# Patient Record
Sex: Male | Born: 1965
Health system: Southern US, Community
[De-identification: ages and names within clinical notes are randomized; demographics above are authoritative.]

## PROBLEM LIST (undated history)

## (undated) DIAGNOSIS — E119 Type 2 diabetes mellitus without complications: Secondary | ICD-10-CM

## (undated) DIAGNOSIS — E291 Testicular hypofunction: Secondary | ICD-10-CM

## (undated) DIAGNOSIS — E789 Disorder of lipoprotein metabolism, unspecified: Secondary | ICD-10-CM

## (undated) DIAGNOSIS — F32A Depression, unspecified: Secondary | ICD-10-CM

## (undated) DIAGNOSIS — I10 Essential (primary) hypertension: Secondary | ICD-10-CM

## (undated) DIAGNOSIS — E7800 Pure hypercholesterolemia, unspecified: Secondary | ICD-10-CM

## (undated) DIAGNOSIS — F329 Major depressive disorder, single episode, unspecified: Secondary | ICD-10-CM

## (undated) DIAGNOSIS — Z87442 Personal history of urinary calculi: Secondary | ICD-10-CM

## (undated) HISTORY — DX: Type 2 diabetes mellitus without complications: E11.9

## (undated) HISTORY — DX: Testicular hypofunction: E29.1

## (undated) HISTORY — DX: Major depressive disorder, single episode, unspecified: F32.9

## (undated) HISTORY — PX: WISDOM TOOTH EXTRACTION: SHX21

## (undated) HISTORY — DX: Depression, unspecified: F32.A

---

## 1998-04-30 ENCOUNTER — Inpatient Hospital Stay (HOSPITAL_COMMUNITY): Admission: EM | Admit: 1998-04-30 | Discharge: 1998-05-04 | Payer: Self-pay | Admitting: *Deleted

## 1998-05-05 ENCOUNTER — Encounter (HOSPITAL_COMMUNITY): Admission: RE | Admit: 1998-05-05 | Discharge: 1998-08-03 | Payer: Self-pay | Admitting: Psychiatry

## 2010-05-24 ENCOUNTER — Emergency Department (HOSPITAL_COMMUNITY): Admission: EM | Admit: 2010-05-24 | Discharge: 2010-05-24 | Payer: Self-pay | Admitting: Emergency Medicine

## 2011-10-26 ENCOUNTER — Encounter: Payer: Self-pay | Admitting: *Deleted

## 2011-10-26 ENCOUNTER — Emergency Department (INDEPENDENT_AMBULATORY_CARE_PROVIDER_SITE_OTHER)
Admission: EM | Admit: 2011-10-26 | Discharge: 2011-10-26 | Disposition: A | Discharge status: Home / Self Care | Payer: PRIVATE HEALTH INSURANCE | Source: Home / Self Care | Attending: Emergency Medicine | Admitting: Emergency Medicine

## 2011-10-26 ENCOUNTER — Emergency Department (INDEPENDENT_AMBULATORY_CARE_PROVIDER_SITE_OTHER): Payer: PRIVATE HEALTH INSURANCE

## 2011-10-26 DIAGNOSIS — S335XXA Sprain of ligaments of lumbar spine, initial encounter: Secondary | ICD-10-CM

## 2011-10-26 DIAGNOSIS — S39012A Strain of muscle, fascia and tendon of lower back, initial encounter: Secondary | ICD-10-CM

## 2011-10-26 HISTORY — DX: Essential (primary) hypertension: I10

## 2011-10-26 HISTORY — DX: Disorder of lipoprotein metabolism, unspecified: E78.9

## 2011-10-26 HISTORY — DX: Pure hypercholesterolemia, unspecified: E78.00

## 2011-10-26 MED ORDER — DICLOFENAC SODIUM 75 MG PO TBEC: 75.0000 mg | DELAYED_RELEASE_TABLET | Freq: Two times a day (BID) | ORAL | Status: AC

## 2011-10-26 MED ORDER — ACETAMINOPHEN-CODEINE #3 300-30 MG PO TABS: 1.0000 | ORAL_TABLET | ORAL | Status: AC | PRN

## 2011-10-26 MED ORDER — CYCLOBENZAPRINE HCL 5 MG PO TABS
5.0000 mg | ORAL_TABLET | Freq: Three times a day (TID) | ORAL | Status: AC | PRN
Start: 2011-10-26 — End: 2011-11-05

## 2011-10-26 NOTE — Discharge Instructions (Signed)
Back Exercises Back exercises help treat and prevent back injuries. The goal of back exercises is to increase the strength of your abdominal and back muscles and the flexibility of your back. These exercises should be started when you no longer have back pain. Back exercises include:  Pelvic Tilt. Lie on your back with your knees bent. Tilt your pelvis until the lower part of your back is against the floor. Hold this position 5 to 10 sec and repeat 5 to 10 times.   Knee to Chest. Pull first 1 knee up against your chest and hold for 20 to 30 seconds, repeat this with the other knee, and then both knees. This may be done with the other leg straight or bent, whichever feels better.   Sit-Ups or Curl-Ups. Bend your knees 90 degrees. Start with tilting your pelvis, and do a partial, slow sit-up, lifting your trunk only 30 to 45 degrees off the floor. Take at least 2 to 3 seconds for each sit-up. Do not do sit-ups with your knees out straight. If partial sit-ups are difficult, simply do the above but with only tightening your abdominal muscles and holding it as directed.   Hip-Lift. Lie on your back with your knees flexed 90 degrees. Push down with your feet and shoulders as you raise your hips a couple inches off the floor; hold for 10 seconds, repeat 5 to 10 times.   Back arches. Lie on your stomach, propping yourself up on bent elbows. Slowly press on your hands, causing an arch in your low back. Repeat 3 to 5 times. Any initial stiffness and discomfort should lessen with repetition over time.   Shoulder-Lifts. Lie face down with arms beside your body. Keep hips and torso pressed to floor as you slowly lift your head and shoulders off the floor.  Do not overdo your exercises, especially in the beginning. Exercises may cause you some mild back discomfort which lasts for a few minutes; however, if the pain is more severe, or lasts for more than 15 minutes, do not continue exercises until you see your  caregiver. Improvement with exercise therapy for back problems is slow.  See your caregivers for assistance with developing a proper back exercise program. Document Released: 11/29/2004 Document Revised: 06/20/2011 Document Reviewed: 10/22/2005 [facility] Patient Information 2012 [place], [place].   Do back exercises twice weekly followed by moist heat.  See Elvera Lennox in 2 weeks.
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
## 2016-12-07 ENCOUNTER — Encounter: Payer: Self-pay | Admitting: Podiatry

## 2016-12-07 ENCOUNTER — Ambulatory Visit (INDEPENDENT_AMBULATORY_CARE_PROVIDER_SITE_OTHER): Payer: PRIVATE HEALTH INSURANCE | Admitting: Podiatry

## 2016-12-07 VITALS — BP 148/92 | HR 82 | Resp 16 | Ht 66.0 in | Wt 225.0 lb

## 2016-12-07 DIAGNOSIS — B351 Tinea unguium: Secondary | ICD-10-CM

## 2016-12-07 DIAGNOSIS — M79604 Pain in right leg: Secondary | ICD-10-CM | POA: Diagnosis not present

## 2016-12-07 DIAGNOSIS — M79605 Pain in left leg: Secondary | ICD-10-CM

## 2016-12-07 NOTE — Progress Notes (Signed)
   Subjective:    Patient ID: Adam Mueller, male    DOB: [birth year], 51 y.o.   MRN: [id number]  HPI Chief Complaint  Patient presents with  . Nail Problem    Bilateral; all toes; pt stated, "wants checked for nail fungus"      Review of Systems  Genitourinary: Positive for frequency.  All other systems reviewed and are negative.      Objective:   Physical Exam        Assessment & Plan:

## 2016-12-08 NOTE — Progress Notes (Signed)
Subjective:     Patient ID: Adam Mueller, male   DOB: 14-Sep-1966, [age] y.o.   MRN: [id number]  HPI patient presents with thick nail disease 1-5 both feet that he has not been able to cut and they become painful and he wants to know what treatment options are available   Review of Systems  All other systems reviewed and are negative.      Objective:   Physical Exam  Constitutional: He is oriented to person, place, and time.  Cardiovascular: Intact distal pulses.   Musculoskeletal: Normal range of motion.  Neurological: He is oriented to person, place, and time.  Skin: Skin is warm.  Nursing note and vitals reviewed.  Neurovascular status intact muscle strength adequate range of motion within normal limits with patient found to have thick deformed nailbeds 1-5 both feet that are moderately tender with odor noted associated with this and long-term history     Assessment:     Chronic mycotic nail infections with thickness pain and inability to wear shoe gear comfortably    Plan:     H&P condition and education rendered to patient. Due to thickness and length of time she's had this and do not believe he would respond to topical or oral medication and I did do deep debridement of all nails smooth them down and this will be done every 3 months. He could consider oral medicine and that decision can be made in future

## 2017-03-08 ENCOUNTER — Ambulatory Visit: Payer: PRIVATE HEALTH INSURANCE

## 2017-09-09 ENCOUNTER — Encounter (HOSPITAL_COMMUNITY): Payer: Self-pay | Admitting: Emergency Medicine

## 2017-09-09 ENCOUNTER — Emergency Department (HOSPITAL_COMMUNITY): Payer: No Typology Code available for payment source

## 2017-09-09 DIAGNOSIS — M7522 Bicipital tendinitis, left shoulder: Secondary | ICD-10-CM | POA: Diagnosis not present

## 2017-09-09 DIAGNOSIS — R0602 Shortness of breath: Secondary | ICD-10-CM | POA: Insufficient documentation

## 2017-09-09 DIAGNOSIS — R079 Chest pain, unspecified: Secondary | ICD-10-CM | POA: Diagnosis present

## 2017-09-09 DIAGNOSIS — E119 Type 2 diabetes mellitus without complications: Secondary | ICD-10-CM | POA: Insufficient documentation

## 2017-09-09 DIAGNOSIS — M25512 Pain in left shoulder: Secondary | ICD-10-CM | POA: Insufficient documentation

## 2017-09-09 DIAGNOSIS — I10 Essential (primary) hypertension: Secondary | ICD-10-CM | POA: Insufficient documentation

## 2017-09-09 LAB — CBC
HCT: 39.6 % (ref 39.0–52.0)
Hemoglobin: 12.8 g/dL — ABNORMAL LOW (ref 13.0–17.0)
MCH: 25.5 pg — ABNORMAL LOW (ref 26.0–34.0)
MCHC: 32.3 g/dL (ref 30.0–36.0)
MCV: 78.9 fL (ref 78.0–100.0)
Platelets: 257 10*3/uL (ref 150–400)
RBC: 5.02 MIL/uL (ref 4.22–5.81)
RDW: 14.2 % (ref 11.5–15.5)
WBC: 9.2 10*3/uL (ref 4.0–10.5)

## 2017-09-09 LAB — BASIC METABOLIC PANEL
ANION GAP: 7 (ref 5–15)
BUN: 10 mg/dL (ref 6–20)
CALCIUM: 8.8 mg/dL — AB (ref 8.9–10.3)
CHLORIDE: 104 mmol/L (ref 101–111)
CO2: 23 mmol/L (ref 22–32)
Creatinine, Ser: 0.88 mg/dL (ref 0.61–1.24)
GFR calc non Af Amer: >60 mL/min (ref >60)
Glucose, Bld: 130 mg/dL — ABNORMAL HIGH (ref 65–99)
Potassium: 3.7 mmol/L (ref 3.5–5.1)
SODIUM: 134 mmol/L — AB (ref 135–145)

## 2017-09-09 NOTE — ED Notes (Signed)
Istat trop = 0.00.

## 2017-09-09 NOTE — ED Triage Notes (Signed)
Pt c/o 8/10 left cp radiating to his left arm, some dizziness and nausea, no vomiting, no diarrhea.

## 2017-09-10 ENCOUNTER — Emergency Department (HOSPITAL_COMMUNITY)
Admission: EM | Admit: 2017-09-10 | Discharge: 2017-09-10 | Disposition: A | Discharge status: Home / Self Care | Payer: No Typology Code available for payment source | Attending: Emergency Medicine | Admitting: Emergency Medicine

## 2017-09-10 DIAGNOSIS — M7522 Bicipital tendinitis, left shoulder: Secondary | ICD-10-CM

## 2017-09-10 LAB — I-STAT TROPONIN, ED: Troponin i, poc: 0 ng/mL (ref 0.00–0.08)

## 2017-09-10 MED ORDER — NAPROXEN 500 MG PO TABS: 500.0000 mg | ORAL_TABLET | Freq: Two times a day (BID) | ORAL | 0 refills | Status: DC

## 2017-09-10 MED ORDER — IBUPROFEN 400 MG PO TABS
600.0000 mg | ORAL_TABLET | Freq: Once | ORAL | Status: AC
  Administered 2017-09-10: 600 mg via ORAL
  Filled 2017-09-10: qty 1

## 2017-09-10 MED ORDER — CYCLOBENZAPRINE HCL 10 MG PO TABS: 10.0000 mg | ORAL_TABLET | Freq: Every evening | ORAL | 0 refills | Status: DC | PRN

## 2017-09-10 MED ORDER — CYCLOBENZAPRINE HCL 10 MG PO TABS
5.0000 mg | ORAL_TABLET | Freq: Once | ORAL | Status: AC
  Administered 2017-09-10: 5 mg via ORAL
  Filled 2017-09-10: qty 1

## 2017-09-10 NOTE — ED Notes (Signed)
Pt c/o left shoulder pain that radiated to his chest while at work, pain worsens with movement of left arm, tried topical analgesic with no relief. A/ox4, resp e/u, nad.

## 2017-09-10 NOTE — ED Notes (Signed)
ED Provider at bedside. 

## 2017-09-10 NOTE — Discharge Instructions (Signed)
Take Naproxen twice a day for pain Take Flexeril as needed Follow up with your doctor
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
## 2018-05-06 ENCOUNTER — Emergency Department (HOSPITAL_BASED_OUTPATIENT_CLINIC_OR_DEPARTMENT_OTHER)
Admission: EM | Admit: 2018-05-06 | Discharge: 2018-05-06 | Disposition: A | Discharge status: Home / Self Care | Payer: No Typology Code available for payment source | Attending: Emergency Medicine | Admitting: Emergency Medicine

## 2018-05-06 ENCOUNTER — Emergency Department (HOSPITAL_BASED_OUTPATIENT_CLINIC_OR_DEPARTMENT_OTHER): Payer: No Typology Code available for payment source

## 2018-05-06 ENCOUNTER — Encounter (HOSPITAL_BASED_OUTPATIENT_CLINIC_OR_DEPARTMENT_OTHER): Payer: Self-pay | Admitting: Emergency Medicine

## 2018-05-06 ENCOUNTER — Other Ambulatory Visit: Payer: Self-pay

## 2018-05-06 DIAGNOSIS — N23 Unspecified renal colic: Secondary | ICD-10-CM | POA: Insufficient documentation

## 2018-05-06 DIAGNOSIS — R1031 Right lower quadrant pain: Secondary | ICD-10-CM | POA: Insufficient documentation

## 2018-05-06 DIAGNOSIS — R42 Dizziness and giddiness: Secondary | ICD-10-CM | POA: Diagnosis not present

## 2018-05-06 DIAGNOSIS — Z79899 Other long term (current) drug therapy: Secondary | ICD-10-CM | POA: Diagnosis not present

## 2018-05-06 DIAGNOSIS — E119 Type 2 diabetes mellitus without complications: Secondary | ICD-10-CM | POA: Diagnosis not present

## 2018-05-06 DIAGNOSIS — R110 Nausea: Secondary | ICD-10-CM | POA: Insufficient documentation

## 2018-05-06 DIAGNOSIS — I10 Essential (primary) hypertension: Secondary | ICD-10-CM | POA: Diagnosis not present

## 2018-05-06 DIAGNOSIS — Z7902 Long term (current) use of antithrombotics/antiplatelets: Secondary | ICD-10-CM | POA: Insufficient documentation

## 2018-05-06 DIAGNOSIS — R109 Unspecified abdominal pain: Secondary | ICD-10-CM | POA: Diagnosis present

## 2018-05-06 LAB — COMPREHENSIVE METABOLIC PANEL
ALBUMIN: 3.6 g/dL (ref 3.5–5.0)
ALT: 15 U/L (ref 0–44)
ANION GAP: 7 (ref 5–15)
AST: 18 U/L (ref 15–41)
Alkaline Phosphatase: 86 U/L (ref 38–126)
BILIRUBIN TOTAL: 0.7 mg/dL (ref 0.3–1.2)
BUN: 14 mg/dL (ref 6–20)
CO2: 25 mmol/L (ref 22–32)
Calcium: 8.4 mg/dL — ABNORMAL LOW (ref 8.9–10.3)
Chloride: 107 mmol/L (ref 98–111)
Creatinine, Ser: 1.23 mg/dL (ref 0.61–1.24)
GFR calc Af Amer: >60 mL/min (ref >60)
GFR calc non Af Amer: >60 mL/min (ref >60)
GLUCOSE: 141 mg/dL — AB (ref 70–99)
POTASSIUM: 3.8 mmol/L (ref 3.5–5.1)
SODIUM: 139 mmol/L (ref 135–145)
Total Protein: 7.3 g/dL (ref 6.5–8.1)

## 2018-05-06 LAB — CBC WITH DIFFERENTIAL/PLATELET
BASOS ABS: 0 10*3/uL (ref 0.0–0.1)
Basophils Relative: 0 %
EOS PCT: 1 %
Eosinophils Absolute: 0.1 10*3/uL (ref 0.0–0.7)
HEMATOCRIT: 39.4 % (ref 39.0–52.0)
Hemoglobin: 12.9 g/dL — ABNORMAL LOW (ref 13.0–17.0)
LYMPHS ABS: 1.7 10*3/uL (ref 0.7–4.0)
LYMPHS PCT: 19 %
MCH: 25.6 pg — AB (ref 26.0–34.0)
MCHC: 32.7 g/dL (ref 30.0–36.0)
MCV: 78.2 fL (ref 78.0–100.0)
MONO ABS: 1.2 10*3/uL — AB (ref 0.1–1.0)
MONOS PCT: 13 %
NEUTROS ABS: 6.2 10*3/uL (ref 1.7–7.7)
Neutrophils Relative %: 67 %
PLATELETS: 241 10*3/uL (ref 150–400)
RBC: 5.04 MIL/uL (ref 4.22–5.81)
RDW: 14.9 % (ref 11.5–15.5)
WBC: 9.2 10*3/uL (ref 4.0–10.5)

## 2018-05-06 LAB — URINALYSIS, ROUTINE W REFLEX MICROSCOPIC
Bilirubin Urine: NEGATIVE
GLUCOSE, UA: NEGATIVE mg/dL
KETONES UR: NEGATIVE mg/dL
Leukocytes, UA: NEGATIVE
NITRITE: NEGATIVE
PH: 5.5 (ref 5.0–8.0)
PROTEIN: NEGATIVE mg/dL
SPECIFIC GRAVITY, URINE: 1.02 (ref 1.005–1.030)

## 2018-05-06 LAB — URINALYSIS, MICROSCOPIC (REFLEX): WBC, UA: NONE SEEN WBC/hpf (ref 0–5)

## 2018-05-06 LAB — LIPASE, BLOOD: Lipase: 26 U/L (ref 11–51)

## 2018-05-06 MED ORDER — OXYCODONE-ACETAMINOPHEN 5-325 MG PO TABS: 1.0000 | ORAL_TABLET | Freq: Three times a day (TID) | ORAL | 0 refills | Status: DC | PRN

## 2018-05-06 MED ORDER — IOPAMIDOL (ISOVUE-300) INJECTION 61%
100.0000 mL | Freq: Once | INTRAVENOUS | Status: AC | PRN
  Administered 2018-05-06: 100 mL via INTRAVENOUS

## 2018-05-06 MED ORDER — SODIUM CHLORIDE 0.9 % IV BOLUS
500.0000 mL | Freq: Once | INTRAVENOUS | Status: AC
  Administered 2018-05-06: 500 mL via INTRAVENOUS

## 2018-05-06 MED ORDER — TAMSULOSIN HCL 0.4 MG PO CAPS: 0.4000 mg | ORAL_CAPSULE | Freq: Every day | ORAL | 0 refills | Status: DC

## 2018-05-06 MED ORDER — KETOROLAC TROMETHAMINE 15 MG/ML IJ SOLN
15.0000 mg | Freq: Once | INTRAMUSCULAR | Status: AC
Start: 2018-05-06 — End: 2018-05-06
  Administered 2018-05-06: 15 mg via INTRAVENOUS
  Filled 2018-05-06: qty 1

## 2018-05-06 MED FILL — OXYCODONE-ACETAMINOPHEN 5-3: 5-325 | 4 days supply | Qty: 12 | Fill #0

## 2018-05-06 MED FILL — TAMSULOSIN HCL 0.4 MG CAP: 0.4 | 30 days supply | Qty: 30 | Fill #0

## 2018-05-06 NOTE — Discharge Instructions (Addendum)
You have a 7mm stone in your right ureter.  Get rechecked immediately if you develop fevers, can't urinate, or have uncontrolled pain.

## 2018-05-06 NOTE — ED Notes (Signed)
Pt given strainer to take home.  

## 2018-05-06 NOTE — ED Triage Notes (Signed)
Right side pain that occasionally radiates to abdomen for two days.  Some nausea.  No V/D.  No fever.  Pt was at work today and stood up becoming dizzy. Pt continues to have dizziness lying down but has improved.  Pt eating and drinking normally.

## 2018-05-06 NOTE — ED Provider Notes (Signed)
[facility] EMERGENCY DEPARTMENT Provider Note   CSN: [id number] Arrival date & time: 05/06/18  4098     History   Chief Complaint Chief Complaint  Patient presents with  . Abdominal Pain    right side    HPI RANDLE SHATZER is a 52 y.o. male.  The history is provided by the patient. No language interpreter was used.  Abdominal Pain     LAUREL SMELTZ is a 52 y.o. male who presents to the Emergency Department complaining of abdominal pain. He reports of right sided abdominal pain that began two days ago. Pain is located along the right axillary line and radiates to the right lower quadrant. It is worse with standing and better with activity. He has associated nausea as well as dizziness at times. He denies any fevers, vomiting, dysuria, chest pain, shortness of breath. No prior similar symptoms. He has a history of hypertension, hyperlipidemia and prediabetes. Symptoms are moderate, constant, worsening. Past Medical History:  Diagnosis Date  . Cholesterol serum elevated   . Depression   . Diabetes mellitus without complication (HCC)   . Hypertension   . Hypogonadism in male     There are no active problems to display for this patient.   No past surgical history on file.      Home Medications    Prior to Admission medications   Medication Sig Start Date End Date Taking? Authorizing Provider  amLODipine (NORVASC) 10 MG tablet Take 10 mg by mouth daily.    [provider]  cyclobenzaprine (FLEXERIL) 10 MG tablet Take 1 tablet (10 mg total) at bedtime and may repeat dose one time if needed by mouth. 09/10/17   Bethel Born, PA-C  losartan (COZAAR) 100 MG tablet Take 100 mg by mouth daily.      [provider]  naproxen (NAPROSYN) 500 MG tablet Take 1 tablet (500 mg total) 2 (two) times daily by mouth. 09/10/17   Bethel Born, PA-C  oxyCODONE-acetaminophen (PERCOCET/ROXICET) 5-325 MG tablet Take 1 tablet by mouth every 8 (eight) hours as  needed for severe pain. 05/06/18   Tilden Fossa, MD  pravastatin (PRAVACHOL) 40 MG tablet Take 40 mg by mouth daily.      [provider]  tadalafil (CIALIS) 20 MG tablet Take 20 mg daily as needed by mouth for erectile dysfunction.    [provider]  tamsulosin (FLOMAX) 0.4 MG CAPS capsule Take 1 capsule (0.4 mg total) by mouth daily. 05/06/18   Tilden Fossa, MD    Family History Family History  Problem Relation Age of Onset  . Diabetes Mother   . Heart failure Mother     Social History Social History   Tobacco Use  . Smoking status: Never Smoker  . Smokeless tobacco: Never Used  Substance Use Topics  . Alcohol use: No  . Drug use: Not on file     Allergies   Patient has no known allergies.   Review of Systems Review of Systems  Gastrointestinal: Positive for abdominal pain.  All other systems reviewed and are negative.    Physical Exam Updated Vital Signs BP (!) 145/94 (BP Location: Right Arm)   Pulse 66   Temp 98 F (36.7 C) (Oral)   Resp 18   Ht 5\' 6"  (1.676 m)   Wt 106.6 kg (235 lb)   SpO2 96%   BMI 37.93 kg/m   Physical Exam  Constitutional: He is oriented to person, place, and time. He appears  well-developed and well-nourished.  HENT:  Head: Normocephalic and atraumatic.  Cardiovascular: Normal rate and regular rhythm.  No murmur heard. Pulmonary/Chest: Effort normal and breath sounds normal. No respiratory distress.  Abdominal: Soft. There is no rebound and no guarding.  Mild to moderate tenderness in the right lower quadrant and right mid abdomen. No guarding or rebound. Negative Murphy's.  Musculoskeletal: He exhibits no edema or tenderness.  Neurological: He is alert and oriented to person, place, and time.  Skin: Skin is warm and dry.  Psychiatric: He has a normal mood and affect. His behavior is normal.  Nursing note and vitals reviewed.    ED Treatments / Results  Labs (all labs ordered are listed, but only  abnormal results are displayed) Labs Reviewed  COMPREHENSIVE METABOLIC PANEL - Abnormal; Notable for the following components:      Result Value   Glucose, Bld 141 (*)    Calcium 8.4 (*)    All other components within normal limits  CBC WITH DIFFERENTIAL/PLATELET - Abnormal; Notable for the following components:   Hemoglobin 12.9 (*)    MCH 25.6 (*)    Monocytes Absolute 1.2 (*)    All other components within normal limits  URINALYSIS, ROUTINE W REFLEX MICROSCOPIC - Abnormal; Notable for the following components:   Hgb urine dipstick LARGE (*)    All other components within normal limits  URINALYSIS, MICROSCOPIC (REFLEX) - Abnormal; Notable for the following components:   Bacteria, UA FEW (*)    All other components within normal limits  LIPASE, BLOOD    EKG None  Radiology Ct Abdomen Pelvis W Contrast  Result Date: 05/06/2018 CLINICAL DATA:  Right-sided abdominal pain, some nausea EXAM: CT ABDOMEN AND PELVIS WITH CONTRAST TECHNIQUE: Multidetector CT imaging of the abdomen and pelvis was performed using the standard protocol following bolus administration of intravenous contrast. CONTRAST:  ISOVUE-300 IOPAMIDOL (ISOVUE-300) INJECTION 61% COMPARISON:  Lumbar spine films of 10/26/2011 FINDINGS: Lower chest: The lung bases are clear. The heart is borderline enlarged. Hepatobiliary: The liver enhances with no focal abnormality and no ductal dilatation is seen. No calcified gallstones are noted. Pancreas: The pancreas is normal in size and the pancreatic duct is not dilated. Spleen: The spleen is unremarkable. Adrenals/Urinary Tract: The adrenal glands appear normal. The kidneys enhance and there is somewhat delayed excretion of contrast by the right kidney with mild perinephric strandiness. There is slight right sided hydronephrosis and hydroureter present to point of obstruction by a 7 mm mid proximal right ureteral calculus. No additional renal calculi are noted. The more distal ureters  are normal in caliber. The urinary bladder is slightly thick-walled but not well distended. This could be due to lack of distension or a degree of bladder outlet obstruction versus edema. Stomach/Bowel: The stomach is not well distended. No small bowel abnormality is seen. There is some feces throughout the colon. Terminal ileum is unremarkable. The appendix is well seen with no evidence of acute appendicitis. Vascular/Lymphatic: The abdominal aorta is normal in caliber with mild abdominal aortic atherosclerosis noted. No adenopathy is seen with only small nodes present. Reproductive: The prostate is minimally prominent for age. Other: No abdominal wall hernia is noted. Musculoskeletal: The lumbar vertebrae are in normal alignment with normal intervertebral disc spaces. The SI joints appear well corticated. IMPRESSION: 1. Mild right hydronephrosis and proximal right hydroureter due to a 7 mm mid proximal right ureteral calculus. No additional renal calculi are seen. 2. Somewhat thick-walled urinary bladder may be due to lack  of distension although edema as with cystitis or a degree of bladder outlet obstruction cannot be excluded. Minimal prominence of the prostate. 3. No abnormality of the appendix is seen. The terminal ileum also is unremarkable. Electronically Signed   By: Dwyane DeePaul  Barry M.D.   On: 05/06/2018 10:11    Procedures Procedures (including critical care time)  Medications Ordered in ED Medications  sodium chloride 0.9 % bolus 500 mL (0 mLs Intravenous Stopped 05/06/18 1003)  iopamidol (ISOVUE-300) 61 % injection 100 mL (100 mLs Intravenous Contrast Given 05/06/18 0952)     Initial Impression / Assessment and Plan / ED Course  I have reviewed the triage vital signs and the nursing notes.  Pertinent labs & imaging results that were available during my care of the patient were reviewed by me and considered in my medical decision making (see chart for details).     Patient here for evaluation  of right sided abdominal pain. He does have some mild tenderness on examination with no peritoneal findings. CT scan does demonstrate a right ureteral calculus. Labs demonstrate normal renal function. UA not consistent with UTI. Patient's pain is controlled in the department. Discussed with patient findings of kidney stone. Discussed importance of urology follow-up as well as close return precautions.  No evidence of acute infectious process, appendicitis, bowel obstruction.  Final Clinical Impressions(s) / ED Diagnoses   Final diagnoses:  Renal colic on right side    ED Discharge Orders        Ordered    oxyCODONE-acetaminophen (PERCOCET/ROXICET) 5-325 MG tablet  Every 8 hours PRN     05/06/18 1042    tamsulosin (FLOMAX) 0.4 MG CAPS capsule  Daily     05/06/18 1042       Tilden Fossaees, Delorice Bannister, MD 05/06/18 1044

## 2018-05-06 NOTE — ED Notes (Signed)
Patient transported to CT 

## 2018-05-07 ENCOUNTER — Other Ambulatory Visit: Payer: Self-pay | Admitting: Urology

## 2018-05-14 ENCOUNTER — Encounter (HOSPITAL_COMMUNITY): Payer: Self-pay | Admitting: General Practice

## 2018-05-15 ENCOUNTER — Encounter (HOSPITAL_COMMUNITY)
Admission: RE | Disposition: A | Discharge status: Home / Self Care | Payer: Self-pay | Source: Ambulatory Visit | Attending: Urology

## 2018-05-15 ENCOUNTER — Ambulatory Visit (HOSPITAL_COMMUNITY)
Admission: RE | Admit: 2018-05-15 | Discharge: 2018-05-15 | Disposition: A | Discharge status: Home / Self Care | Payer: No Typology Code available for payment source | Source: Ambulatory Visit | Attending: Urology | Admitting: Urology

## 2018-05-15 ENCOUNTER — Ambulatory Visit (HOSPITAL_COMMUNITY): Payer: No Typology Code available for payment source

## 2018-05-15 ENCOUNTER — Encounter (HOSPITAL_COMMUNITY): Payer: Self-pay | Admitting: *Deleted

## 2018-05-15 DIAGNOSIS — N201 Calculus of ureter: Secondary | ICD-10-CM

## 2018-05-15 DIAGNOSIS — E785 Hyperlipidemia, unspecified: Secondary | ICD-10-CM | POA: Insufficient documentation

## 2018-05-15 DIAGNOSIS — Z6836 Body mass index (BMI) 36.0-36.9, adult: Secondary | ICD-10-CM | POA: Diagnosis not present

## 2018-05-15 DIAGNOSIS — E669 Obesity, unspecified: Secondary | ICD-10-CM | POA: Insufficient documentation

## 2018-05-15 DIAGNOSIS — I10 Essential (primary) hypertension: Secondary | ICD-10-CM | POA: Diagnosis not present

## 2018-05-15 DIAGNOSIS — E291 Testicular hypofunction: Secondary | ICD-10-CM | POA: Diagnosis not present

## 2018-05-15 DIAGNOSIS — E119 Type 2 diabetes mellitus without complications: Secondary | ICD-10-CM | POA: Diagnosis not present

## 2018-05-15 HISTORY — PX: EXTRACORPOREAL SHOCK WAVE LITHOTRIPSY: SHX1557

## 2018-05-15 HISTORY — DX: Personal history of urinary calculi: Z87.442

## 2018-05-15 LAB — GLUCOSE, CAPILLARY: Glucose-Capillary: 107 mg/dL — ABNORMAL HIGH (ref 70–99)

## 2018-05-15 SURGERY — LITHOTRIPSY, ESWL
Anesthesia: LOCAL | Laterality: Right

## 2018-05-15 MED ORDER — CIPROFLOXACIN HCL 500 MG PO TABS
500.0000 mg | ORAL_TABLET | ORAL | Status: AC
  Administered 2018-05-15: 500 mg via ORAL
  Filled 2018-05-15: qty 1

## 2018-05-15 MED ORDER — SODIUM CHLORIDE 0.9 % IV SOLN
INTRAVENOUS | Status: DC
  Administered 2018-05-15: 14:00:00 via INTRAVENOUS

## 2018-05-15 MED ORDER — DIPHENHYDRAMINE HCL 25 MG PO CAPS
25.0000 mg | ORAL_CAPSULE | ORAL | Status: AC
  Administered 2018-05-15: 25 mg via ORAL
  Filled 2018-05-15: qty 1

## 2018-05-15 MED ORDER — DIAZEPAM 5 MG PO TABS
10.0000 mg | ORAL_TABLET | ORAL | Status: AC
  Administered 2018-05-15: 10 mg via ORAL
  Filled 2018-05-15: qty 2

## 2018-05-15 NOTE — Brief Op Note (Signed)
05/15/2018  5:08 PM  PATIENT:  Adam Mueller  52 y.o. male  PRE-OPERATIVE DIAGNOSIS:  RIGHT URETERAL STONE  POST-OPERATIVE DIAGNOSIS:  * No post-op diagnosis entered *  PROCEDURE:  Procedure(s): RIGHT EXTRACORPOREAL SHOCK WAVE LITHOTRIPSY (ESWL) (Right)  SURGEON:  Surgeon(s) and Role:    * Alexis Frock, MD - Primary  PHYSICIAN ASSISTANT:   ASSISTANTS: none   ANESTHESIA:   MAC  EBL:  nil   BLOOD ADMINISTERED:none  DRAINS: none   LOCAL MEDICATIONS USED:  NONE  SPECIMEN:  No Specimen  DISPOSITION OF SPECIMEN:  N/A  COUNTS:  YES  TOURNIQUET:  * No tourniquets in log *  DICTATION: .Note written in paper chart  PLAN OF CARE: Admit to inpatient   PATIENT DISPOSITION:  PACU - hemodynamically stable.   Delay start of Pharmacological VTE agent (>24hrs) due to surgical blood loss or risk of bleeding: not applicable

## 2018-05-15 NOTE — Discharge Instructions (Signed)
1 - You may have urinary urgency (bladder spasms), bloody urine on / off, and pass small stone fragments for up to 2 weeks. This is normal. ° °2 - Call MD or go to ER for fever >102, severe pain / nausea / vomiting not relieved by medications, or acute change in medical status ° °

## 2018-05-15 NOTE — H&P (Signed)
Adam BlazerBobby R Mueller is an 52 y.o. male.    Chief Complaint: Pre-Op RIGHT Shockwave Lithotripsy  HPI:   1 - Right Ureteral Stone - Rt 7mm mid ureteral stone by ER CT 05/2018 on eval flank pain. Stone is solitary, 7mm, 600HU, SSD 15cm at level of L4-L5 interspace by KUB. Cr 1.2, UA without infectious parameters.   PMH sig for HTN, HLD, Obesity. NO ischemic CV disease / blood thinners. NO chest / abd surgery. Marland Kitchen. 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 His PCP is Holley Boucheandall Harris MD.   Today " Adam Mueller " is seen to proceed with RIGHT shockwave lithotripsy.   Past Medical History:  Diagnosis Date  . Cholesterol serum elevated   . Depression   . Diabetes mellitus without complication (HCC)    boderline with no medication  . History of kidney stones   . Hypertension   . Hypogonadism in male     Past Surgical History:  Procedure Laterality Date  . WISDOM TOOTH EXTRACTION      Family History  Problem Relation Age of Onset  . Diabetes Mother   . Heart failure Mother    Social History:  reports that he has never smoked. He has never used smokeless tobacco. He reports that he does not drink alcohol. His drug history is not on file.  Allergies: No Known Allergies  No medications prior to admission.    No results found for this or any previous visit (from the past 48 hour(s)). No results found.  Review of Systems  Constitutional: Negative.  Negative for chills and fever.  HENT: Negative.   Eyes: Negative.   Respiratory: Negative.   Cardiovascular: Negative.   Gastrointestinal: Negative.   Genitourinary: Positive for flank pain.  Skin: Negative.   Neurological: Negative.   Endo/Heme/Allergies: Negative.   Psychiatric/Behavioral: Negative.     There were no vitals taken for this visit. Physical Exam  Constitutional: He appears well-developed.  HENT:  Head: Normocephalic.  Eyes: Pupils are equal, round, and reactive to light.  Neck: Normal range of motion.   Respiratory: Effort normal.  GI: Soft.  Genitourinary:  Genitourinary Comments: Mild Rt CVAT.   Musculoskeletal: Normal range of motion.  Neurological: He is alert.  Skin: Skin is warm.  Psychiatric: He has a normal mood and affect.     Assessment/Plan   1 - Right Ureteral Stone - proceed as planned with shockwave lithotripsy. Risks, benefits, alternatives, expected peri-procedure course discussed previously and reiterated today.   Sebastian AcheMANNY, Morty Ortwein, MD 05/15/2018, 8:22 AM

## 2018-08-25 ENCOUNTER — Encounter (HOSPITAL_COMMUNITY): Payer: Self-pay | Admitting: Urology

## 2018-09-05 IMAGING — CT CT ABD-PELV W/ CM
2 of 5 series · 15 of 46 positions shown, 17 images · IV contrast (APPLIED)
Comparison: Lumbar spine films of 10/26/2011

CLINICAL DATA: Right-sided abdominal pain, some nausea

EXAM:
CT ABDOMEN AND PELVIS WITH CONTRAST
TECHNIQUE: Multidetector CT imaging of the abdomen and pelvis was performed
using the standard protocol following bolus administration of
intravenous contrast.
CONTRAST:  100mL 7J49A2-PHH IOPAMIDOL (7J49A2-PHH) INJECTION 61%

[Series 2: axial st · axial · 0.92mm/px · z∈[-575,-15]mm · 12 of 124 slices shown, 14 images]
[im 6/124  soft-tissue]
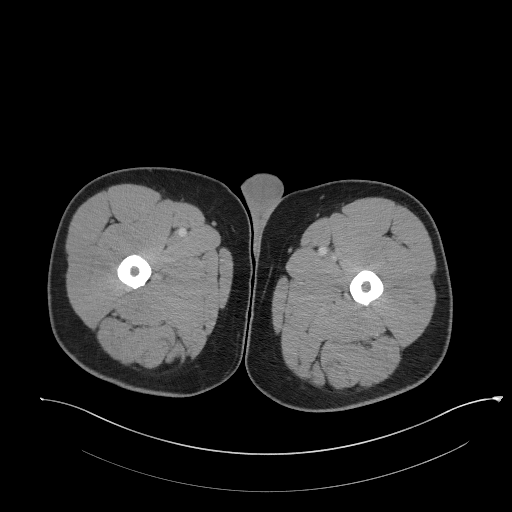
[im 6/124  bone]
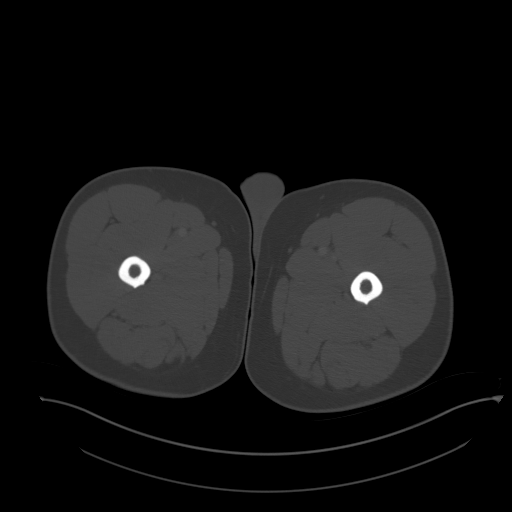
[im 18/124  soft-tissue]
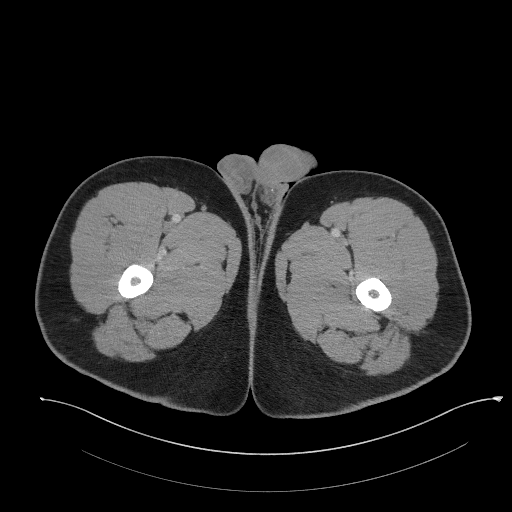
[im 30/124  soft-tissue]
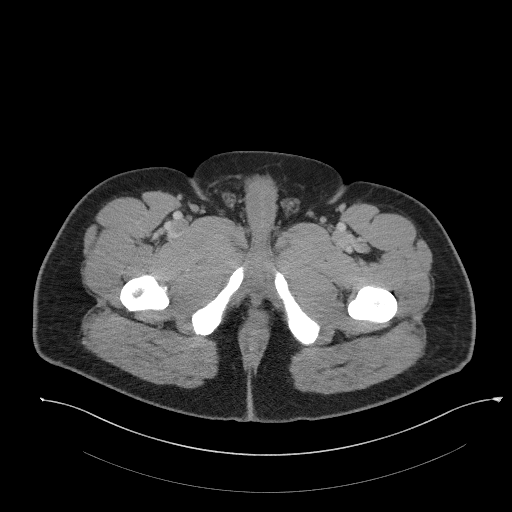
[im 36/124  soft-tissue]
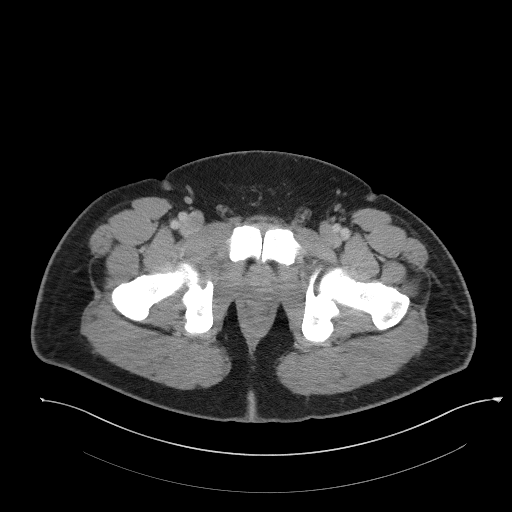
[im 47/124  soft-tissue]
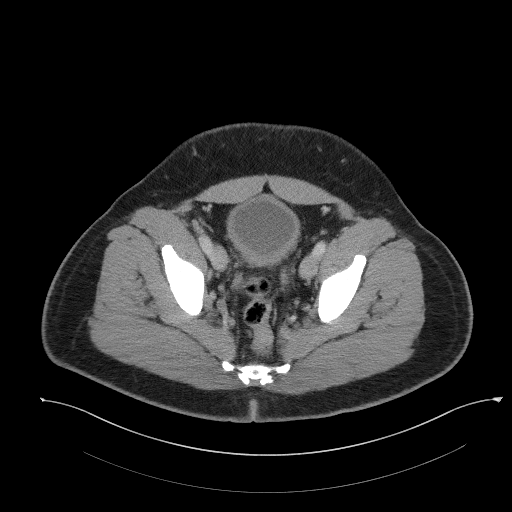
[im 59/124  soft-tissue]
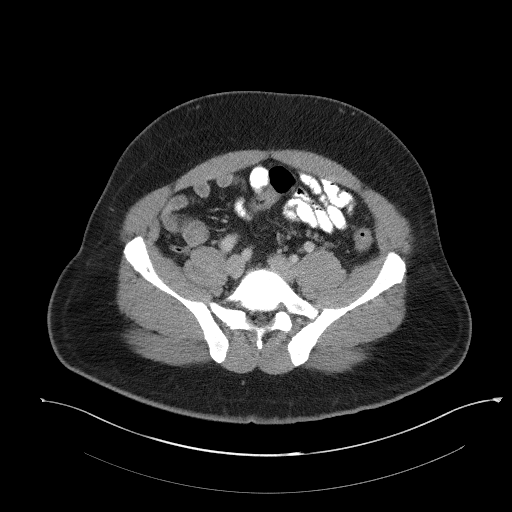
[im 65/124  soft-tissue]
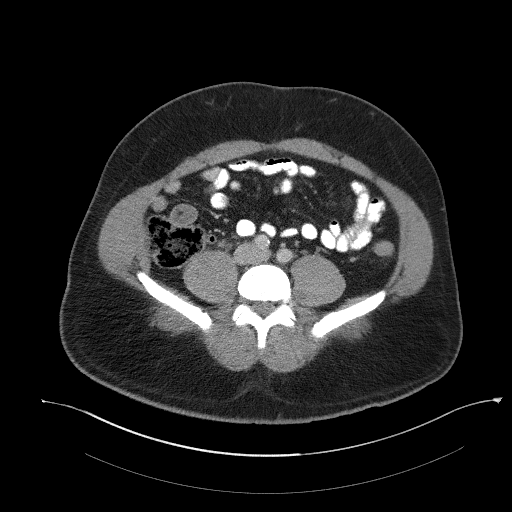
[im 77/124  soft-tissue]
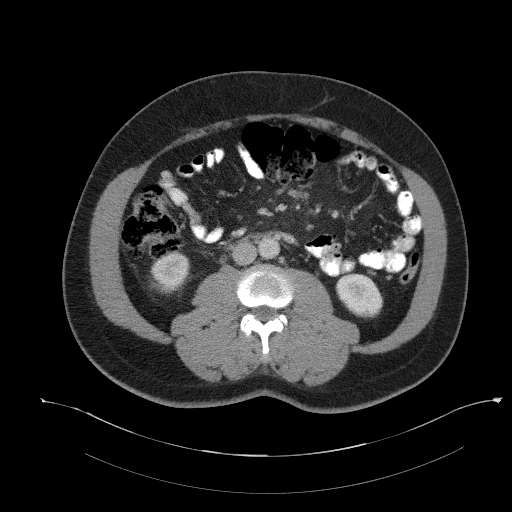
[im 88/124  soft-tissue]
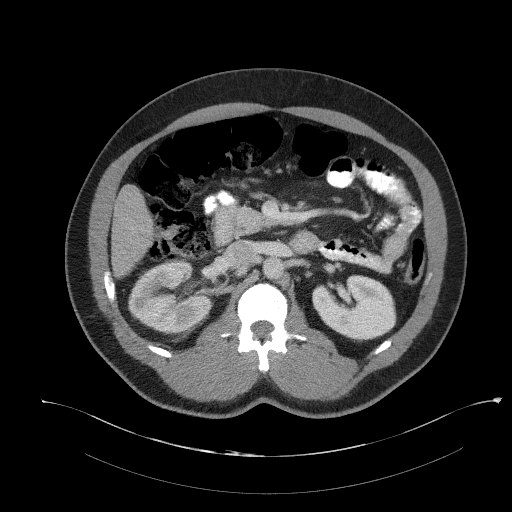
[im 88/124  bone]
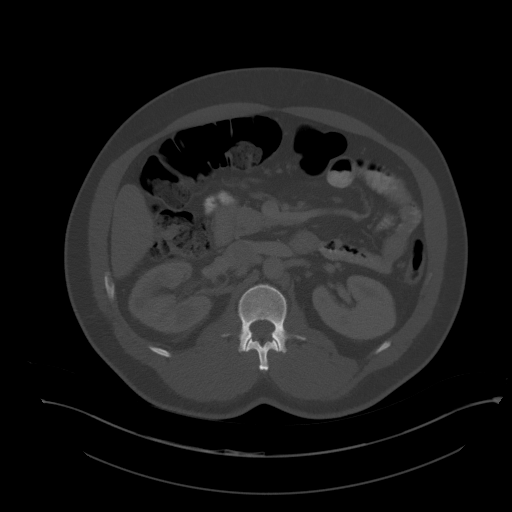
[im 94/124  soft-tissue]
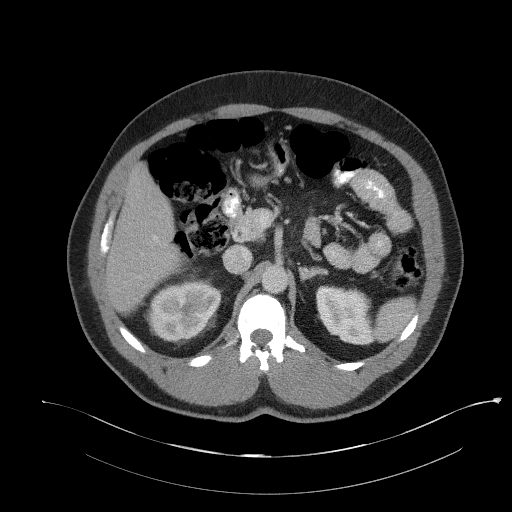
[im 106/124  soft-tissue]
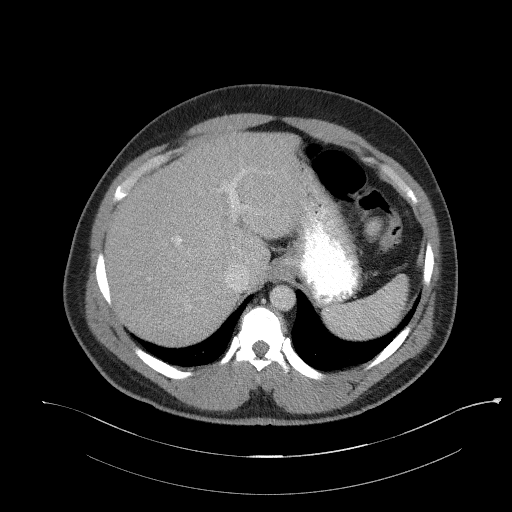
[im 118/124  soft-tissue]
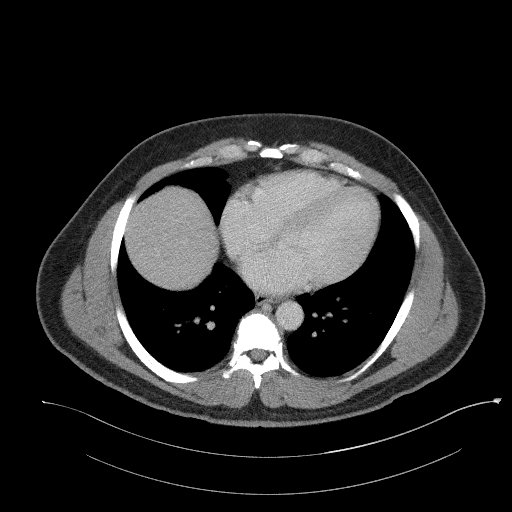

[Series 4: coronal st · coronal · 0.87mm/px · 3 of 109 slices shown]
[im 37/109  soft-tissue]
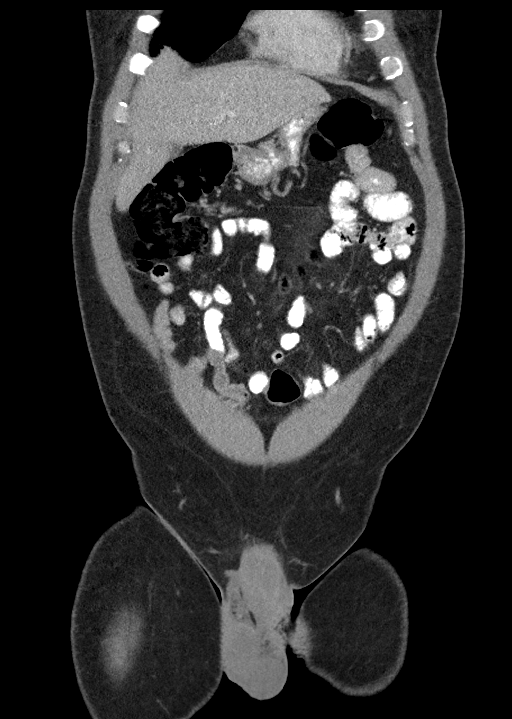
[im 49/109  soft-tissue]
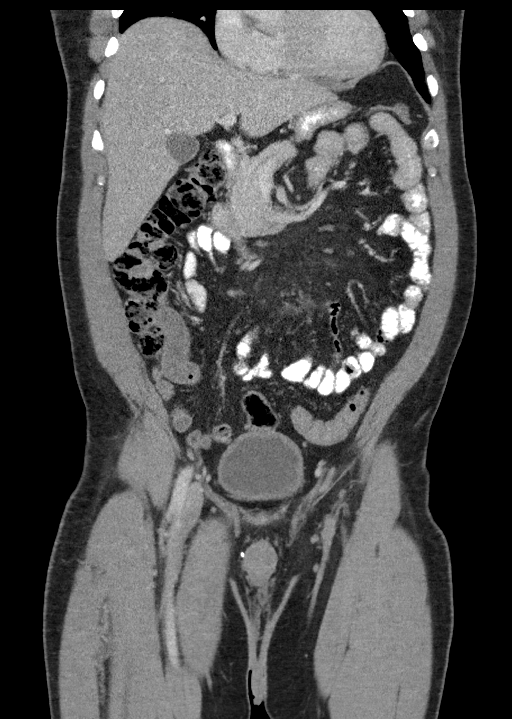
[im 61/109  soft-tissue]
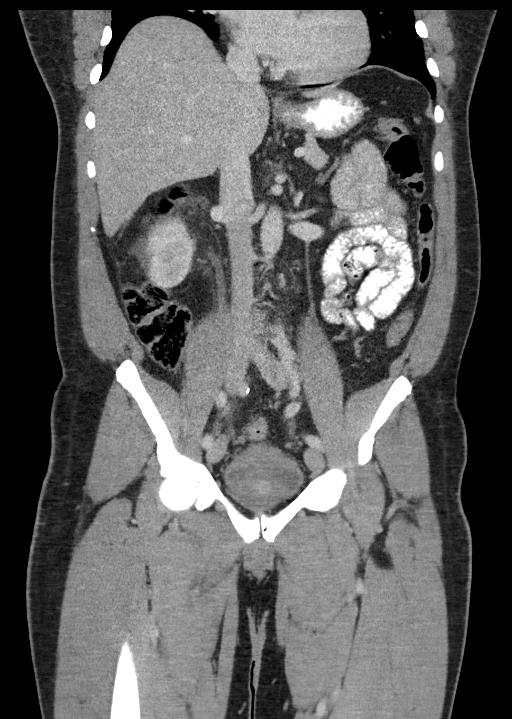

[15 of 46 positions shown; findings below may reference images not displayed]

FINDINGS: Lower chest: The lung bases are clear. The heart is borderline
enlarged.

Hepatobiliary: The liver enhances with no focal abnormality and no
ductal dilatation is seen. No calcified gallstones are noted.

Pancreas: The pancreas is normal in size and the pancreatic duct is
not dilated.

Spleen: The spleen is unremarkable.

Adrenals/Urinary Tract: The adrenal glands appear normal. The
kidneys enhance and there is somewhat delayed excretion of contrast
by the right kidney with mild perinephric strandiness. There is
slight right sided hydronephrosis and hydroureter present to point
of obstruction by a 7 mm mid proximal right ureteral calculus. No
additional renal calculi are noted. The more distal ureters are
normal in caliber. The urinary bladder is slightly thick-walled but
not well distended. This could be due to lack of distension or a
degree of bladder outlet obstruction versus edema.

Stomach/Bowel: The stomach is not well distended. No small bowel
abnormality is seen. There is some feces throughout the colon.
Terminal ileum is unremarkable. The appendix is well seen with no
evidence of acute appendicitis.

Vascular/Lymphatic: The abdominal aorta is normal in caliber with
mild abdominal aortic atherosclerosis noted. No adenopathy is seen
with only small nodes present.

Reproductive: The prostate is minimally prominent for age.

Other: No abdominal wall hernia is noted.

Musculoskeletal: The lumbar vertebrae are in normal alignment with
normal intervertebral disc spaces. The SI joints appear well
corticated.
IMPRESSION: 1. Mild right hydronephrosis and proximal right hydroureter due to a
7 mm mid proximal right ureteral calculus. No additional renal
calculi are seen.
2. Somewhat thick-walled urinary bladder may be due to lack of
distension although edema as with cystitis or a degree of bladder
outlet obstruction cannot be excluded. Minimal prominence of the
prostate.
3. No abnormality of the appendix is seen. The terminal ileum also
is unremarkable.

## 2018-09-14 IMAGING — CR DG ABDOMEN 1V
2 series · 2 of 2 positions shown · non-contrast
Comparison: CT and KUB from 05/06/2018

CLINICAL DATA: Preop for lithotripsy.

EXAM:
ABDOMEN - 1 VIEW

[t abdomen supine (1 of 2)]
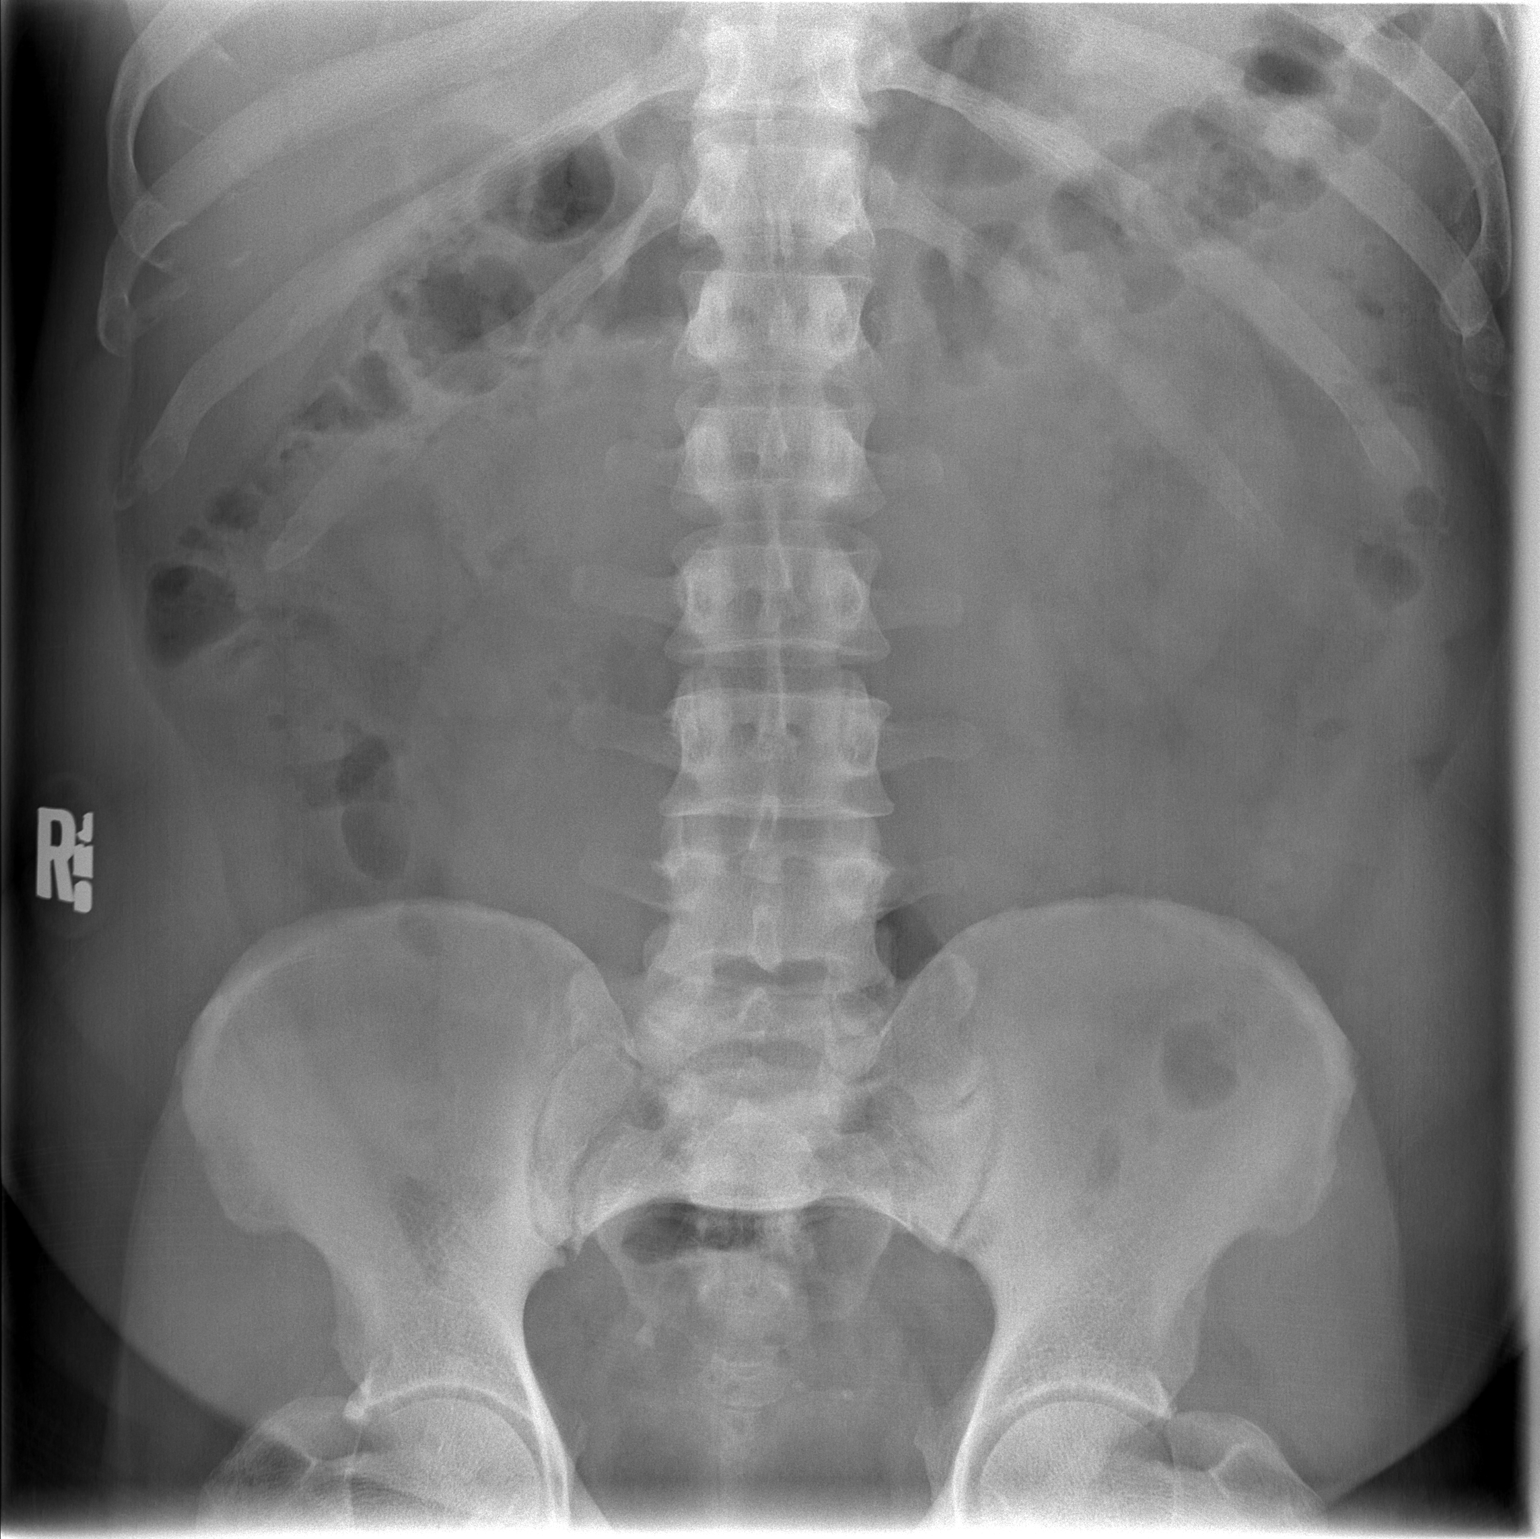

[t abdomen supine (2 of 2)]
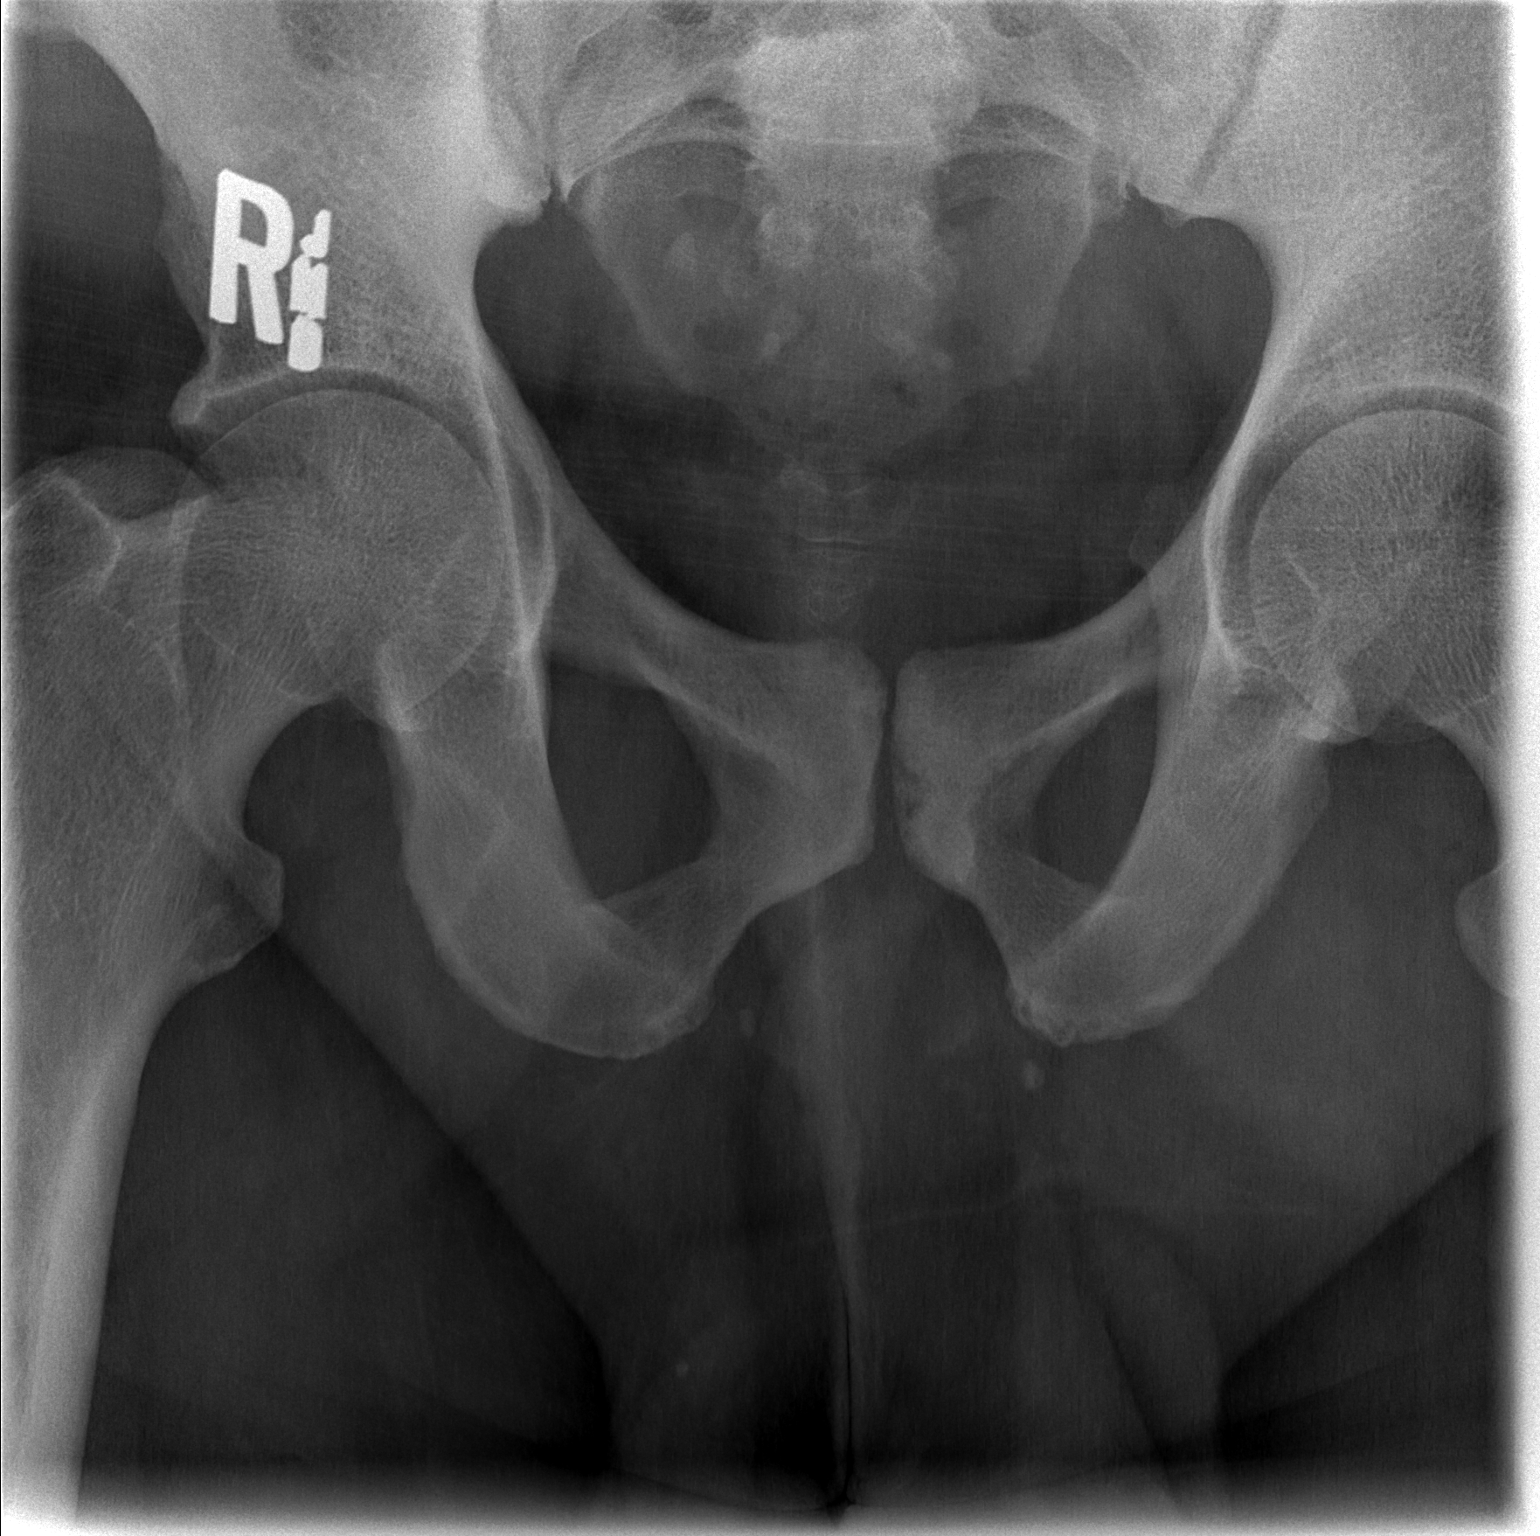

[2 of 2 positions shown; findings below may reference images not displayed]

FINDINGS: The triangular-shaped 6 mm right ureteral calculus noted in the mid
right ureter on previous exams appears to have migrated into the
right hemipelvis and may be within the distal right ureter
currently. Bowel gas pattern is unremarkable. No nephrolithiasis. No
acute osseous abnormality.
IMPRESSION: 1. Apparent migration of known mid right ureteral 6 mm calculus into
the right hemipelvis currently.
2. No additional genitourinary calculi. Unremarkable bowel gas
pattern.

## 2022-07-18 ENCOUNTER — Other Ambulatory Visit: Payer: Self-pay

## 2022-07-18 ENCOUNTER — Encounter (HOSPITAL_BASED_OUTPATIENT_CLINIC_OR_DEPARTMENT_OTHER): Payer: Self-pay | Admitting: Emergency Medicine

## 2022-07-18 ENCOUNTER — Encounter (HOSPITAL_COMMUNITY): Payer: Self-pay

## 2022-07-18 ENCOUNTER — Emergency Department (HOSPITAL_BASED_OUTPATIENT_CLINIC_OR_DEPARTMENT_OTHER): Payer: No Typology Code available for payment source | Admitting: Radiology

## 2022-07-18 ENCOUNTER — Observation Stay (HOSPITAL_BASED_OUTPATIENT_CLINIC_OR_DEPARTMENT_OTHER)
Admission: EM | Admit: 2022-07-18 | Discharge: 2022-07-20 | Disposition: A | Discharge status: Still In Hospital | Payer: No Typology Code available for payment source | Attending: Internal Medicine | Admitting: Internal Medicine

## 2022-07-18 DIAGNOSIS — E119 Type 2 diabetes mellitus without complications: Secondary | ICD-10-CM | POA: Diagnosis not present

## 2022-07-18 DIAGNOSIS — I10 Essential (primary) hypertension: Secondary | ICD-10-CM | POA: Diagnosis not present

## 2022-07-18 DIAGNOSIS — E7800 Pure hypercholesterolemia, unspecified: Secondary | ICD-10-CM | POA: Insufficient documentation

## 2022-07-18 DIAGNOSIS — R079 Chest pain, unspecified: Secondary | ICD-10-CM

## 2022-07-18 DIAGNOSIS — R0789 Other chest pain: Principal | ICD-10-CM | POA: Insufficient documentation

## 2022-07-18 DIAGNOSIS — R072 Precordial pain: Secondary | ICD-10-CM | POA: Diagnosis present

## 2022-07-18 DIAGNOSIS — Z79899 Other long term (current) drug therapy: Secondary | ICD-10-CM | POA: Insufficient documentation

## 2022-07-18 DIAGNOSIS — E789 Disorder of lipoprotein metabolism, unspecified: Secondary | ICD-10-CM

## 2022-07-18 LAB — CBC
HCT: 42.4 % (ref 39.0–52.0)
Hemoglobin: 13.8 g/dL (ref 13.0–17.0)
MCH: 26.2 pg (ref 26.0–34.0)
MCHC: 32.5 g/dL (ref 30.0–36.0)
MCV: 80.6 fL (ref 80.0–100.0)
Platelets: 253 10*3/uL (ref 150–400)
RBC: 5.26 MIL/uL (ref 4.22–5.81)
RDW: 14.7 % (ref 11.5–15.5)
WBC: 6.4 10*3/uL (ref 4.0–10.5)
nRBC: 0 % (ref 0.0–0.2)

## 2022-07-18 LAB — TROPONIN I (HIGH SENSITIVITY)
Troponin I (High Sensitivity): 9 ng/L (ref <18)
Troponin I (High Sensitivity): 8 ng/L (ref <18)

## 2022-07-18 LAB — BASIC METABOLIC PANEL
Anion gap: 8 (ref 5–15)
BUN: 17 mg/dL (ref 6–20)
CO2: 26 mmol/L (ref 22–32)
Calcium: 9.5 mg/dL (ref 8.9–10.3)
Chloride: 105 mmol/L (ref 98–111)
Creatinine, Ser: 0.9 mg/dL (ref 0.61–1.24)
GFR, Estimated: >60 mL/min (ref >60)
Glucose, Bld: 107 mg/dL — ABNORMAL HIGH (ref 70–99)
Potassium: 3.8 mmol/L (ref 3.5–5.1)
Sodium: 139 mmol/L (ref 135–145)

## 2022-07-18 MED ORDER — NITROGLYCERIN 0.4 MG SL SUBL
0.4000 mg | SUBLINGUAL_TABLET | Freq: Once | SUBLINGUAL | Status: AC
  Administered 2022-07-18: 0.4 mg via SUBLINGUAL
  Filled 2022-07-18: qty 1

## 2022-07-18 MED ORDER — ASPIRIN 81 MG PO CHEW
324.0000 mg | CHEWABLE_TABLET | Freq: Once | ORAL | Status: AC
Start: 2022-07-18 — End: 2022-07-18
  Administered 2022-07-18: 324 mg via ORAL
  Filled 2022-07-18: qty 4

## 2022-07-18 NOTE — ED Provider Notes (Signed)
[facility] EMERGENCY DEPT Provider Note   CSN: [id number] Arrival date & time: 07/18/22  1754     History  Chief Complaint  Patient presents with   Chest Pain    Adam Mueller is a 56 y.o. male.   Chest Pain Patient presents with chest pain.  Started while at work today.  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  Dull in his chest.  States it is worse with activity but does not necessarily improve with rest.  No fevers.  No coughing.  It is dull in his mid chest.  No nausea or vomiting.  No diaphoresis.  History of high cholesterol and hypertension.    Past Medical History:  Diagnosis Date   Cholesterol serum elevated    Depression    Diabetes mellitus without complication (HCC)    boderline with no medication   History of kidney stones    Hypertension    Hypogonadism in male     Home Medications Prior to Admission medications   Medication Sig Start Date End Date Taking? Authorizing Provider  acetaminophen (TYLENOL) 500 MG tablet Take 500 mg by mouth as needed.    [provider]  amLODipine (NORVASC) 10 MG tablet Take 10 mg by mouth daily.    [provider]  cyclobenzaprine (FLEXERIL) 10 MG tablet Take 1 tablet (10 mg total) at bedtime and may repeat dose one time if needed by mouth. 09/10/17   Bethel Born, PA-C  losartan (COZAAR) 100 MG tablet Take 100 mg by mouth daily.      [provider]  naproxen (NAPROSYN) 500 MG tablet Take 1 tablet (500 mg total) 2 (two) times daily by mouth. 09/10/17   Bethel Born, PA-C  oxyCODONE-acetaminophen (PERCOCET/ROXICET) 5-325 MG tablet Take 1 tablet by mouth every 8 (eight) hours as needed for severe pain. 05/06/18   Tilden Fossa, MD  pravastatin (PRAVACHOL) 40 MG tablet Take 40 mg by mouth daily.      [provider]  tadalafil (CIALIS) 20 MG tablet Take 20 mg daily as needed by mouth for erectile dysfunction.    [provider]  tamsulosin  (FLOMAX) 0.4 MG CAPS capsule Take 1 capsule (0.4 mg total) by mouth daily. 05/06/18   Tilden Fossa, MD      Allergies    Patient has no known allergies.    Review of Systems   Review of Systems  Cardiovascular:  Positive for chest pain.    Physical Exam Updated Vital Signs BP 112/72   Pulse 60   Temp 97.9 F (36.6 C) (Oral)   Resp 13   SpO2 93%  Physical Exam Vitals and nursing note reviewed.  Eyes:     Pupils: Pupils are equal, round, and reactive to light.  Cardiovascular:     Rate and Rhythm: Normal rate and regular rhythm.  Pulmonary:     Effort: No tachypnea.  Chest:     Chest wall: No tenderness.  Abdominal:     Tenderness: There is no abdominal tenderness.  Musculoskeletal:     Right lower leg: No edema.     Left lower leg: No edema.  Skin:    General: Skin is warm.     Capillary Refill: Capillary refill takes less than 2 seconds.  Neurological:     Mental Status: He is alert and oriented to person, place, and time.     ED Results / Procedures / Treatments   Labs (all labs ordered are  listed, but only abnormal results are displayed) Labs Reviewed  BASIC METABOLIC PANEL - Abnormal; Notable for the following components:      Result Value   Glucose, Bld 107 (*)    All other components within normal limits  CBC  TROPONIN I (HIGH SENSITIVITY)  TROPONIN I (HIGH SENSITIVITY)    EKG EKG Interpretation  Date/Time:  Wednesday July 18 2022 18:11:56 EDT Ventricular Rate:  53 PR Interval:  170 QRS Duration: 102 QT Interval:  448 QTC Calculation: 421 R Axis:   70 Text Interpretation: Sinus rhythm Borderline repolarization abnormality No significant change since last tracing Confirmed by Benjiman Core [phone]) on 07/18/2022 6:15:45 PM  Radiology DG Chest 2 View  Result Date: 07/18/2022 CLINICAL DATA:  Chest pain.  Mild shortness of breath. EXAM: CHEST - 2 VIEW COMPARISON:  09/09/2017 FINDINGS: Right hemidiaphragm eventration anteriorly. Midline  trachea. Normal heart size and mediastinal contours. No pleural effusion or pneumothorax. Clear lungs. IMPRESSION: No acute cardiopulmonary disease. Electronically Signed   By: Jeronimo Greaves M.D.   On: 07/18/2022 18:50    Procedures Procedures    Medications Ordered in ED Medications  aspirin chewable tablet 324 mg (has no administration in time range)  nitroGLYCERIN (NITROSTAT) SL tablet 0.4 mg (0.4 mg Sublingual Given 07/18/22 1843)    ED Course/ Medical Decision Making/ A&P                           Medical Decision Making Amount and/or Complexity of Data Reviewed Labs: ordered. Radiology: ordered.  Risk OTC drugs. Prescription drug management.   Patient presents with chest pain.  Dull in the mid chest.  States it is exertional.  EKG has nonspecific changes but stable to prior.  Troponin negative.  Chest x-ray independently interpreted showed no clear cause of the pain.  No rash.  However patient is higher risk.  Heart pathway score is 4 due to age moderate story nonspecific EKG changes.  Also moderate risk factors. Pulmonary embolism felt less likely.  With higher risk score I feel that he would require admission to the hospital for further work-up.  Patient had been given nitroglycerin that did have some improvement of the pain.  Given aspirin also.  Aortic dissection felt less likely.  Will discuss with hospitalist for admission.        Final Clinical Impression(s) / ED Diagnoses Final diagnoses:  Chest pain, unspecified type    Rx / DC Orders ED Discharge Orders     None         Benjiman Core, MD 07/18/22 1936

## 2022-07-18 NOTE — ED Notes (Signed)
Pt care taken, pt resting no complaints at this time 

## 2022-07-18 NOTE — ED Triage Notes (Signed)
Chest pain with mild sob started today while at work. Reports that chest pain is worse with activity and ok at rest.

## 2022-07-19 ENCOUNTER — Ambulatory Visit (HOSPITAL_BASED_OUTPATIENT_CLINIC_OR_DEPARTMENT_OTHER): Payer: No Typology Code available for payment source

## 2022-07-19 ENCOUNTER — Observation Stay (HOSPITAL_BASED_OUTPATIENT_CLINIC_OR_DEPARTMENT_OTHER): Payer: No Typology Code available for payment source

## 2022-07-19 ENCOUNTER — Encounter (HOSPITAL_COMMUNITY): Payer: Self-pay | Admitting: Internal Medicine

## 2022-07-19 DIAGNOSIS — I10 Essential (primary) hypertension: Secondary | ICD-10-CM

## 2022-07-19 DIAGNOSIS — E789 Disorder of lipoprotein metabolism, unspecified: Secondary | ICD-10-CM

## 2022-07-19 DIAGNOSIS — E119 Type 2 diabetes mellitus without complications: Secondary | ICD-10-CM | POA: Diagnosis not present

## 2022-07-19 DIAGNOSIS — R079 Chest pain, unspecified: Secondary | ICD-10-CM | POA: Diagnosis not present

## 2022-07-19 DIAGNOSIS — R072 Precordial pain: Secondary | ICD-10-CM | POA: Diagnosis not present

## 2022-07-19 DIAGNOSIS — R0789 Other chest pain: Secondary | ICD-10-CM | POA: Diagnosis present

## 2022-07-19 DIAGNOSIS — E7800 Pure hypercholesterolemia, unspecified: Secondary | ICD-10-CM | POA: Diagnosis not present

## 2022-07-19 DIAGNOSIS — Z79899 Other long term (current) drug therapy: Secondary | ICD-10-CM | POA: Diagnosis not present

## 2022-07-19 LAB — CBC WITH DIFFERENTIAL/PLATELET
Abs Immature Granulocytes: 0.01 10*3/uL (ref 0.00–0.07)
Basophils Absolute: 0 10*3/uL (ref 0.0–0.1)
Basophils Relative: 1 %
Eosinophils Absolute: 0.3 10*3/uL (ref 0.0–0.5)
Eosinophils Relative: 4 %
HCT: 41 % (ref 39.0–52.0)
Hemoglobin: 13.1 g/dL (ref 13.0–17.0)
Immature Granulocytes: 0 %
Lymphocytes Relative: 41 %
Lymphs Abs: 2.4 10*3/uL (ref 0.7–4.0)
MCH: 26.1 pg (ref 26.0–34.0)
MCHC: 32 g/dL (ref 30.0–36.0)
MCV: 81.7 fL (ref 80.0–100.0)
Monocytes Absolute: 0.7 10*3/uL (ref 0.1–1.0)
Monocytes Relative: 11 %
Neutro Abs: 2.6 10*3/uL (ref 1.7–7.7)
Neutrophils Relative %: 43 %
Platelets: 250 10*3/uL (ref 150–400)
RBC: 5.02 MIL/uL (ref 4.22–5.81)
RDW: 14.8 % (ref 11.5–15.5)
WBC: 5.9 10*3/uL (ref 4.0–10.5)
nRBC: 0 % (ref 0.0–0.2)

## 2022-07-19 LAB — D-DIMER, QUANTITATIVE: D-Dimer, Quant: 0.31 ug/mL-FEU (ref 0.00–0.50)

## 2022-07-19 LAB — HEMOGLOBIN A1C
Hgb A1c MFr Bld: 7 % — ABNORMAL HIGH (ref 4.8–5.6)
Mean Plasma Glucose: 154.2 mg/dL

## 2022-07-19 LAB — BASIC METABOLIC PANEL
Anion gap: 9 (ref 5–15)
BUN: 19 mg/dL (ref 6–20)
CO2: 24 mmol/L (ref 22–32)
Calcium: 8.8 mg/dL — ABNORMAL LOW (ref 8.9–10.3)
Chloride: 105 mmol/L (ref 98–111)
Creatinine, Ser: 0.98 mg/dL (ref 0.61–1.24)
GFR, Estimated: >60 mL/min (ref >60)
Glucose, Bld: 120 mg/dL — ABNORMAL HIGH (ref 70–99)
Potassium: 4.2 mmol/L (ref 3.5–5.1)
Sodium: 138 mmol/L (ref 135–145)

## 2022-07-19 LAB — LIPID PANEL
Cholesterol: 133 mg/dL (ref 0–200)
HDL: 31 mg/dL — ABNORMAL LOW (ref >40)
LDL Cholesterol: 78 mg/dL (ref 0–99)
Total CHOL/HDL Ratio: 4.3 RATIO
Triglycerides: 121 mg/dL (ref <150)
VLDL: 24 mg/dL (ref 0–40)

## 2022-07-19 LAB — ECHOCARDIOGRAM COMPLETE
Area-P 1/2: 2.69 cm2
Calc EF: 70.2 %
Height: 66 in
S' Lateral: 2.7 cm
Single Plane A2C EF: 71.2 %
Single Plane A4C EF: 71.2 %
Weight: 3401.6 oz

## 2022-07-19 LAB — HIV ANTIBODY (ROUTINE TESTING W REFLEX): HIV Screen 4th Generation wRfx: NONREACTIVE

## 2022-07-19 LAB — TSH: TSH: 0.622 u[IU]/mL (ref 0.350–4.500)

## 2022-07-19 MED ORDER — ONDANSETRON HCL 4 MG PO TABS: 4.0000 mg | ORAL_TABLET | Freq: Four times a day (QID) | ORAL | Status: DC | PRN

## 2022-07-19 MED ORDER — NITROGLYCERIN 0.4 MG SL SUBL
SUBLINGUAL_TABLET | SUBLINGUAL | Status: AC
  Filled 2022-07-19: qty 2

## 2022-07-19 MED ORDER — PANTOPRAZOLE SODIUM 40 MG PO TBEC
40.0000 mg | DELAYED_RELEASE_TABLET | Freq: Every day | ORAL | Status: DC
  Administered 2022-07-19 – 2022-07-20 (×2): 40 mg via ORAL
  Filled 2022-07-19 (×2): qty 1

## 2022-07-19 MED ORDER — NITROGLYCERIN 0.4 MG SL SUBL: 0.4000 mg | SUBLINGUAL_TABLET | SUBLINGUAL | Status: DC | PRN

## 2022-07-19 MED ORDER — LOSARTAN POTASSIUM 50 MG PO TABS
100.0000 mg | ORAL_TABLET | Freq: Every day | ORAL | Status: DC
  Administered 2022-07-19 – 2022-07-20 (×2): 100 mg via ORAL
  Filled 2022-07-19 (×2): qty 2

## 2022-07-19 MED ORDER — ASPIRIN 81 MG PO TBEC
81.0000 mg | DELAYED_RELEASE_TABLET | Freq: Every day | ORAL | Status: DC
  Administered 2022-07-19 – 2022-07-20 (×2): 81 mg via ORAL
  Filled 2022-07-19 (×2): qty 1

## 2022-07-19 MED ORDER — ACETAMINOPHEN 650 MG RE SUPP: 650.0000 mg | Freq: Four times a day (QID) | RECTAL | Status: DC | PRN

## 2022-07-19 MED ORDER — ONDANSETRON HCL 4 MG/2ML IJ SOLN: 4.0000 mg | Freq: Four times a day (QID) | INTRAMUSCULAR | Status: DC | PRN

## 2022-07-19 MED ORDER — HEPARIN SODIUM (PORCINE) 5000 UNIT/ML IJ SOLN
5000.0000 [IU] | Freq: Three times a day (TID) | INTRAMUSCULAR | Status: DC
  Administered 2022-07-19 (×3): 5000 [IU] via SUBCUTANEOUS
  Filled 2022-07-19 (×3): qty 1

## 2022-07-19 MED ORDER — AMLODIPINE BESYLATE 10 MG PO TABS
10.0000 mg | ORAL_TABLET | Freq: Every day | ORAL | Status: DC
  Administered 2022-07-19 – 2022-07-20 (×2): 10 mg via ORAL
  Filled 2022-07-19 (×2): qty 1

## 2022-07-19 MED ORDER — ACETAMINOPHEN 325 MG PO TABS: 650.0000 mg | ORAL_TABLET | Freq: Four times a day (QID) | ORAL | Status: DC | PRN

## 2022-07-19 MED ORDER — PRAVASTATIN SODIUM 40 MG PO TABS
40.0000 mg | ORAL_TABLET | Freq: Every day | ORAL | Status: DC
  Administered 2022-07-19 – 2022-07-20 (×2): 40 mg via ORAL
  Filled 2022-07-19 (×2): qty 1

## 2022-07-19 MED ORDER — IOHEXOL 350 MG/ML SOLN
100.0000 mL | Freq: Once | INTRAVENOUS | Status: AC | PRN
  Administered 2022-07-19: 100 mL via INTRAVENOUS

## 2022-07-19 NOTE — Subjective & Objective (Signed)
CC: chest pain HPI: 56 yo AAM with hx of HTN, DM2, hyperlipidemia, presents to the ER today with chest pain that started at work.  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  He states that while he was work at his daytime job as a [employer] certified accountant, he start developing substernal chest pain that radiated to the back.  Slightly short of breath.  No nausea.  No diaphoresis.  Chest pain continued for about 2 hours.  He called his wife.  Wife instructed him to go to the ER.  Patient was still having substernal chest pain when he arrived to the ER.  Received 2 nitroglycerin sprays and chest pain resolved.  Cardiac markers negative x2.  EKG showed normal sinus rhythm.  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  He states that he has a walk across the whole apartment complex picking up trash outside of peoples apartments.  He has to walk up and down stairs.  Does not get chest pain or shortness of breath with that.  Patient transferred to [facility] for further care.

## 2022-07-19 NOTE — Assessment & Plan Note (Addendum)
Patient maintained on home regimen Norvasc 10 mg daily, losartan 100 mg daily.

## 2022-07-19 NOTE — H&P (Signed)
History and Physical    Adam Mueller EXB:[id number] DOB: [birth year] DOA: 07/18/2022  DOS: the patient was seen and examined on 07/18/2022  PCP: Johny Blamer, MD   Patient coming from: Home  I have personally briefly reviewed patient's old medical records in  Link  CC: chest pain HPI: 56 yo AAM with hx of HTN, DM2, hyperlipidemia, presents to the ER today with chest pain that started at work.  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  Adam Mueller states that while Adam Mueller was work at his daytime job as a Chartered certified accountant, Adam Mueller start developing substernal chest pain that radiated to the back.  Slightly short of breath.  No nausea.  No diaphoresis.  Chest pain continued for about 2 hours.  Adam Mueller called his wife.  Wife instructed him to go to the ER.  Patient was still having substernal chest pain when Adam Mueller arrived to the ER.  Received 2 nitroglycerin sprays and chest pain resolved.  Cardiac markers negative x2.  EKG showed normal sinus rhythm.  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  Adam Mueller states that Adam Mueller has a walk across the whole apartment complex picking up trash outside of peoples apartments.  Adam Mueller has to walk up and down stairs.  Does not get chest pain or shortness of breath with that.  Patient transferred to [facility] for further care.   ED Course: trop negative x 2 sets  Review of Systems:  Review of Systems  Constitutional: Negative.   HENT: Negative.    Respiratory:  Negative for cough, hemoptysis and sputum production.   Cardiovascular:  Positive for chest pain. Negative for palpitations, orthopnea and leg swelling.  Gastrointestinal:  Negative for nausea and vomiting.  Musculoskeletal:        Slightly tender to palpation along costosternal margin. Similar to his chest pain this afternoon.  Skin: Negative.   Neurological: Negative.   Endo/Heme/Allergies: Negative.    Psychiatric/Behavioral: Negative.    All other systems reviewed and are negative.   Past Medical History:  Diagnosis Date   Cholesterol serum elevated    Depression    Diabetes mellitus without complication (HCC)    boderline with no medication   History of kidney stones    Hypertension    Hypogonadism in male     Past Surgical History:  Procedure Laterality Date   EXTRACORPOREAL SHOCK WAVE LITHOTRIPSY Right 05/15/2018   Procedure: RIGHT EXTRACORPOREAL SHOCK WAVE LITHOTRIPSY (ESWL);  Surgeon: Sebastian Ache, MD;  Location: [facility];  Service: Urology;  Laterality: Right;   WISDOM TOOTH EXTRACTION       reports that Adam Mueller has never smoked. Adam Mueller has never used smokeless tobacco. Adam Mueller reports that Adam Mueller does not drink alcohol. No history on file for drug use.  No Known Allergies  Family History  Problem Relation Age of Onset   Diabetes Mother    Heart failure Mother     Prior to Admission medications   Medication Sig Start Date End Date Taking? Authorizing Provider  acetaminophen (TYLENOL) 500 MG tablet Take 500 mg by mouth as needed.    [provider]  amLODipine (NORVASC) 10 MG tablet Take 10 mg by mouth daily.    [provider]  cyclobenzaprine (FLEXERIL) 10 MG tablet Take 1 tablet (10 mg total) at bedtime and may repeat dose one time if needed by mouth. 09/10/17   Terance Hart  Marie, PA-C  losartan (COZAAR) 100 MG tablet Take 100 mg by mouth daily.      [provider]  naproxen (NAPROSYN) 500 MG tablet Take 1 tablet (500 mg total) 2 (two) times daily by mouth. 09/10/17   Bethel Born, PA-C  oxyCODONE-acetaminophen (PERCOCET/ROXICET) 5-325 MG tablet Take 1 tablet by mouth every 8 (eight) hours as needed for severe pain. 05/06/18   Tilden Fossa, MD  pravastatin (PRAVACHOL) 40 MG tablet Take 40 mg by mouth daily.      [provider]  tadalafil (CIALIS) 20 MG tablet Take 20 mg daily as needed by mouth for erectile dysfunction.    [provider]  tamsulosin (FLOMAX) 0.4 MG CAPS capsule Take 1 capsule (0.4 mg total) by mouth daily. 05/06/18   Tilden Fossa, MD    Physical Exam: Vitals:   07/19/22 0009 07/19/22 0030 07/19/22 0128 07/19/22 0132  BP: 125/89 126/84  (!) 147/84  Pulse: (!) 52 (!) 48  (!) 49  Resp: 18 16  16   Temp: 98.2 F (36.8 C) 98.1 F (36.7 C)  97.6 F (36.4 C)  TempSrc: Oral   Oral  SpO2: 96% 94%  96%  Weight:   96.4 kg   Height:   5\' 6"  (1.676 m)     Physical Exam Vitals and nursing note reviewed.  Constitutional:      General: Adam Mueller is not in acute distress.    Appearance: Normal appearance. Adam Mueller is normal weight. Adam Mueller is not ill-appearing, toxic-appearing or diaphoretic.  HENT:     Head: Normocephalic and atraumatic.     Nose: Nose normal.  Cardiovascular:     Rate and Rhythm: Regular rhythm. Bradycardia present.     Pulses: Normal pulses.  Pulmonary:     Effort: Pulmonary effort is normal. No respiratory distress.     Breath sounds: Normal breath sounds. No wheezing.  Chest:     Chest wall: Tenderness present.    Abdominal:     General: Abdomen is flat. Bowel sounds are normal. There is no distension.     Tenderness: There is no abdominal tenderness. There is no guarding or rebound.  Musculoskeletal:     Right lower leg: No edema.     Left lower leg: No edema.  Skin:    General: Skin is warm and dry.     Capillary Refill: Capillary refill takes less than 2 seconds.  Neurological:     General: No focal deficit present.     Mental Status: Adam Mueller is alert and oriented to person, place, and time.      Labs on Admission: I have personally reviewed following labs and imaging studies  CBC: Recent Labs  Lab 07/18/22 1819  WBC 6.4  HGB 13.8  HCT 42.4  MCV 80.6  PLT 253   Basic Metabolic Panel: Recent Labs  Lab 07/18/22 1819  NA 139  K 3.8  CL 105  CO2 26  GLUCOSE 107*  BUN 17  CREATININE 0.90  CALCIUM 9.5   GFR: Estimated Creatinine Clearance: 97.2 mL/min (by C-G  formula based on SCr of 0.9 mg/dL). Liver Function Tests: No results for input(s): "AST", "ALT", "ALKPHOS", "BILITOT", "PROT", "ALBUMIN" in the last 168 hours. No results for input(s): "LIPASE", "AMYLASE" in the last 168 hours. No results for input(s): "AMMONIA" in the last 168 hours. Coagulation Profile: No results for input(s): "INR", "PROTIME" in the last 168 hours. Cardiac Enzymes: Recent Labs  Lab 07/18/22 1819 07/18/22 2027  TROPONINIHS 9 8  BNP (last 3 results) No results for input(s): "PROBNP" in the last 8760 hours. HbA1C: No results for input(s): "HGBA1C" in the last 72 hours. CBG: No results for input(s): "GLUCAP" in the last 168 hours. Lipid Profile: No results for input(s): "CHOL", "HDL", "LDLCALC", "TRIG", "CHOLHDL", "LDLDIRECT" in the last 72 hours. Thyroid Function Tests: No results for input(s): "TSH", "T4TOTAL", "FREET4", "T3FREE", "THYROIDAB" in the last 72 hours. Anemia Panel: No results for input(s): "VITAMINB12", "FOLATE", "FERRITIN", "TIBC", "IRON", "RETICCTPCT" in the last 72 hours. Urine analysis:    Component Value Date/Time   COLORURINE YELLOW 05/06/2018 0904   APPEARANCEUR CLEAR 05/06/2018 0904   LABSPEC 1.020 05/06/2018 0904   PHURINE 5.5 05/06/2018 0904   GLUCOSEU NEGATIVE 05/06/2018 0904   HGBUR LARGE (A) 05/06/2018 0904   BILIRUBINUR NEGATIVE 05/06/2018 0904   KETONESUR NEGATIVE 05/06/2018 0904   PROTEINUR NEGATIVE 05/06/2018 0904   NITRITE NEGATIVE 05/06/2018 0904   LEUKOCYTESUR NEGATIVE 05/06/2018 0904    Radiological Exams on Admission: I have personally reviewed images DG Chest 2 View  Result Date: 07/18/2022 CLINICAL DATA:  Chest pain.  Mild shortness of breath. EXAM: CHEST - 2 VIEW COMPARISON:  09/09/2017 FINDINGS: Right hemidiaphragm eventration anteriorly. Midline trachea. Normal heart size and mediastinal contours. No pleural effusion or pneumothorax. Clear lungs. IMPRESSION: No acute cardiopulmonary disease. Electronically Signed    By: Jeronimo Greaves M.D.   On: 07/18/2022 18:50    EKG: My personal interpretation of EKG shows: sinus bradycardia    Assessment/Plan Principal Problem:   Chest pain Active Problems:   Diabetes mellitus without complication (HCC)   Hypertension   Elevated serum cholesterol    Assessment and Plan: * Chest pain Observation telemetry bed. EKG WNL. Troponin negative x 2 sets. Keep NPO. Cardiology consulted. Continue with ASA 81 mg daily. Check lipid panel. Has baseline bradycardia without AV nodal blocker drugs. Pt has not taking Cialis in several weeks.  Elevated serum cholesterol Check lipid panel. Continue pravastatin 40 mg daily.  Hypertension Stable. Continue norvasc 10 mg and losartan 100 mg daily.  Diabetes mellitus without complication (HCC) Check A1c. Hold metformin.    DVT prophylaxis: SQ Heparin Code Status: Full Code Family Communication: discussed with pt and wife Annabelle Harman at bedside  Disposition Plan: return home  Consults called: cardiology notified of consult via Epic   Admission status: Observation, Telemetry bed   Carollee Herter, DO [facility] 07/19/2022, 2:58 AM

## 2022-07-19 NOTE — Assessment & Plan Note (Addendum)
Hemoglobin A1c 7.0.   -Oral hypoglycemic agents were held during the hospitalization and patient maintained on SSI.

## 2022-07-19 NOTE — Assessment & Plan Note (Addendum)
-   Fasting lipid panel with LDL of 78.   -Patient maintained on pravastatin during the hospitalization and recommended per cardiology to be changed to Crestor on discharge.   -Outpatient follow-up with PCP.

## 2022-07-19 NOTE — Assessment & Plan Note (Addendum)
Patient admitted for work-up of chest pain.  EKG done with no ischemic changes noted.  -High-sensitivity troponin obtained was negative x2.   -D-dimer negative. -Due to multiple cardiac risk factors cardiology consulted and recommended cardiac CT for further evaluation.   -Fasting lipid panel with LDL of 78.  -Patient maintained on ASA 81 mg daily, Cozaar, statin, PPI. -Patient noted with a bradycardia without AV nodal blocking agents. -Patient underwent cardiac CTA that showed evidence of mild coronary artery disease.  2D echo with EF > 75%, NWMA, grade 1 diastolic dysfunction.  -Patient with clinical improvement, was cleared by cardiology for discharge and recommended that patient may need outpatient ambulatory monitor for PVC.   -Cardiology recommended changing patient's pravastatin to Crestor and patient continued on aspirin, Norvasc, losartan with routine cardiology follow-up.   -Per cardiology note patient's wife reported that PCP will refer patient to cardiology.   -Patient will be placed on scheduled ibuprofen 600 mg 3 times daily x5 days with close outpatient follow-up with PCP.

## 2022-07-19 NOTE — Consult Note (Addendum)
Cardiology Consultation   Patient ID: Adam Mueller MRN: [id number]; DOB: 08/12/1966  Admit date: 07/18/2022 Date of Consult: 07/19/2022  PCP:  Johny Blamer, MD   [facility] Providers Cardiologist:  None   {   Patient Profile:   Adam Mueller is a 57 y.o. male with a hx of HTN, type 2 diabetes, obesity,  who is being seen 07/19/2022 for the evaluation of chest pain at the request of Dr Imogene Burn.  History of Present Illness:   Adam Mueller with above PMH presented to ER with c/o chest pain. 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 He was not doing anything exertional. He states pain was dull aching in quality. Pain was 4 -5 out of 10. Pain lasted all day until night time, where he got Nitro from ER. He felt pain is starting to recur this morning, 2-3 out of 10. He reports associated SOB, lightheadedness. He denied any nausea, emesis, paresthesia, syncope. He states his brother had CAD required bypass surgery at age of 6, his mother and sister died from CHF. He does not smokes. He has HTN, compliant with his antihypertensive. He had no prior hx of cardiac disease or workup.    Admission diagnostics showed unremarkable BMP and CBC diff. Hs trop 9 >8. LDL 78, HDL 31. EKG with sinus rhythm, old inferolateral TWI. CXR showed no acute process. Cardiology is consulted for chest pain today.     Past Medical History:  Diagnosis Date   Cholesterol serum elevated    Depression    Diabetes mellitus without complication (HCC)    boderline with no medication   History of kidney stones    Hypertension    Hypogonadism in male     Past Surgical History:  Procedure Laterality Date   EXTRACORPOREAL SHOCK WAVE LITHOTRIPSY Right 05/15/2018   Procedure: RIGHT EXTRACORPOREAL SHOCK WAVE LITHOTRIPSY (ESWL);  Surgeon: Sebastian Ache, MD;  Location: [facility];  Service: Urology;  Laterality: Right;   WISDOM TOOTH EXTRACTION       Home  Medications:  Prior to Admission medications   Medication Sig Start Date End Date Taking? Authorizing Provider  amLODipine (NORVASC) 10 MG tablet Take 10 mg by mouth daily.   Yes [provider]  losartan (COZAAR) 100 MG tablet Take 100 mg by mouth daily.     Yes [provider]  metFORMIN (GLUCOPHAGE) 500 MG tablet Take 500 mg by mouth 2 (two) times daily. 03/30/22  Yes [provider]  pravastatin (PRAVACHOL) 40 MG tablet Take 40 mg by mouth daily.     Yes [provider]    Inpatient Medications: Scheduled Meds:  amLODipine  10 mg Oral Daily   aspirin EC  81 mg Oral Daily   heparin  5,000 Units Subcutaneous Q8H   losartan  100 mg Oral Daily   pantoprazole  40 mg Oral Q0600   pravastatin  40 mg Oral Daily   Continuous Infusions:  PRN Meds: acetaminophen **OR** acetaminophen, nitroGLYCERIN, ondansetron **OR** ondansetron (ZOFRAN) IV  Allergies:   No Known Allergies  Social History:   Social History   Socioeconomic History   Marital status: Married    Spouse name: Not on file   Number of children: Not on file   Years of education: Not on file   Highest education level: Not on file  Occupational History   Not on file  Tobacco Use   Smoking status: Never   Smokeless  tobacco: Never  Substance and Sexual Activity   Alcohol use: No   Drug use: Not on file   Sexual activity: Not on file  Other Topics Concern   Not on file  Social History Narrative   Not on file   Social Determinants of Health   Financial Resource Strain: Not on file  Food Insecurity: Not on file  Transportation Needs: Not on file  Physical Activity: Not on file  Stress: Not on file  Social Connections: Not on file  Intimate Partner Violence: Not on file    Family History:    Family History  Problem Relation Age of Onset   Diabetes Mother    Heart failure Mother      ROS:  Constitutional: Denied fever, chills, malaise, night sweats Eyes: Denied vision  change or loss Ears/Nose/Mouth/Throat: Denied ear ache, sore throat, coughing, sinus pain Cardiovascular: see HPI  Respiratory: see HPI  Gastrointestinal: Denied nausea, vomiting, abdominal pain, diarrhea Genital/Urinary: Denied dysuria, hematuria, urinary frequency/urgency Musculoskeletal: Denied muscle ache, joint pain, weakness Skin: Denied rash, wound Neuro: Denied headache, dizziness, syncope Psych: Denied history of depression/anxiety  Endocrine: history of pre-diabetes   Physical Exam/Data:   Vitals:   07/19/22 0128 07/19/22 0132 07/19/22 0310 07/19/22 0756  BP:  (!) 147/84 110/65 123/81  Pulse:  (!) 49 (!) 54 (!) 52  Resp:  16 13 16   Temp:  97.6 F (36.4 C) 97.6 F (36.4 C) 97.9 F (36.6 C)  TempSrc:  Oral Oral Oral  SpO2:  96% 94% 92%  Weight: 96.4 kg     Height: 5\' 6"  (1.676 m)      No intake or output data in the 24 hours ending 07/19/22 1121    07/19/2022    1:28 AM 05/15/2018    1:00 PM 05/06/2018    8:35 AM  Last 3 Weights  Weight (lbs) 212 lb 9.6 oz 225 lb 235 lb  Weight (kg) 96.435 kg 102.059 kg 106.595 kg     Body mass index is 34.31 kg/m.   Vitals:  Vitals:   07/19/22 0310 07/19/22 0756  BP: 110/65 123/81  Pulse: (!) 54 (!) 52  Resp: 13 16  Temp: 97.6 F (36.4 C) 97.9 F (36.6 C)  SpO2: 94% 92%   General Appearance: In no apparent distress, laying in bed HEENT: Normocephalic, atraumatic.  Neck: Supple, trachea midline, no JVDs Cardiovascular: Regular rate and rhythm, normal S1-S2,  no murmur/rub/gallop Respiratory: Resting breathing unlabored, lungs sounds clear to auscultation bilaterally, no use of accessory muscles. On room air.  No wheezes, rales or rhonchi.   Gastrointestinal: Bowel sounds positive, abdomen soft Extremities: Able to move all extremities in bed without difficulty, no edema Musculoskeletal: Normal muscle bulk and tone Skin: Intact, warm, dry. No rashes or petechiae noted in exposed areas.  Neurologic: Alert, oriented to  person, place and time. Fluent speech, no facial droop, no cognitive deficit Psychiatric: Normal affect. Mood is appropriate.     EKG:  The EKG was personally reviewed and demonstrates:  Sinus bradycardia 53bpm, old TWI of inferolateral leads present in 2018 EKGs  Telemetry:  Telemetry was personally reviewed and demonstrates:  Sinus rhythm /bradycardia 50s-60s  Relevant CV Studies:  Echo is pending   Laboratory Data:  High Sensitivity Troponin:   Recent Labs  Lab 07/18/22 1819 07/18/22 2027  TROPONINIHS 9 8     Chemistry Recent Labs  Lab 07/18/22 1819 07/19/22 1006  NA 139 138  K 3.8 4.2  CL 105 105  CO2 26 24  GLUCOSE 107* 120*  BUN 17 19  CREATININE 0.90 0.98  CALCIUM 9.5 8.8*  GFRNONAA >60 >60  ANIONGAP 8 9    No results for input(s): "PROT", "ALBUMIN", "AST", "ALT", "ALKPHOS", "BILITOT" in the last 168 hours. Lipids  Recent Labs  Lab 07/19/22 0600  CHOL 133  TRIG 121  HDL 31*  LDLCALC 78  CHOLHDL 4.3    Hematology Recent Labs  Lab 07/18/22 1819 07/19/22 1006  WBC 6.4 5.9  RBC 5.26 5.02  HGB 13.8 13.1  HCT 42.4 41.0  MCV 80.6 81.7  MCH 26.2 26.1  MCHC 32.5 32.0  RDW 14.7 14.8  PLT 253 250   Thyroid No results for input(s): "TSH", "FREET4" in the last 168 hours.  BNPNo results for input(s): "BNP", "PROBNP" in the last 168 hours.  DDimer No results for input(s): "DDIMER" in the last 168 hours.   Radiology/Studies:  DG Chest 2 View  Result Date: 07/18/2022 CLINICAL DATA:  Chest pain.  Mild shortness of breath. EXAM: CHEST - 2 VIEW COMPARISON:  09/09/2017 FINDINGS: Right hemidiaphragm eventration anteriorly. Midline trachea. Normal heart size and mediastinal contours. No pleural effusion or pneumothorax. Clear lungs. IMPRESSION: No acute cardiopulmonary disease. Electronically Signed   By: Jeronimo Greaves M.D.   On: 07/18/2022 18:50     Assessment and Plan:   Chest pain - atypical in feature - Hs trop negative x2 - EKG no acute ischemic  changes  - risk factor: HTN, obesity, diabetes, family hx CAD - A1C 7%; LDL 78, HDL 31, tri 121 - check TSH, Echo pending will follow  - recommend CT coronary study for ischemic evaluation, patient is agreeable  - start ASA 81mg , continue pravastatin 40mg , further recommendation pending workup   HTN - BP elevated at home, on losartan and amlodipine   Type 2 DM  HLD - per primary team   Risk Assessment/Risk Scores:   For questions or updates, please contact [facility] Please consult [email or web address] for contact info under  Signed, , NP  07/19/2022 11:21 AM  Patient seen and examined, note reviewed with the signed Advanced Practice Provider. I personally reviewed laboratory data, imaging studies and relevant notes. I independently examined the patient and formulated the important aspects of the plan. I have personally discussed the plan with the patient and/or family. Comments or changes to the note/plan are indicated below.  He is intermediate risk - plan for CCTA today   Cyndi Bender DO, MS [facility] Attending Cardiologist [facility]  [address] [place], [address] [place] [phone] Website: [phone]

## 2022-07-19 NOTE — Progress Notes (Signed)
  Echocardiogram 2D Echocardiogram has been performed.  Janalyn Harder 07/19/2022, 4:06 PM

## 2022-07-19 NOTE — Progress Notes (Signed)
PROGRESS NOTE    DEZMIN KITTELSON  HCW:[id number] DOB: 1965-12-15 DOA: 07/18/2022 PCP: Johny Blamer, MD    Chief Complaint  Patient presents with   Chest Pain    Brief Narrative:  No notes on file    Assessment & Plan:  Principal Problem:   Chest pain Active Problems:   Diabetes mellitus without complication (HCC)   Hypertension   Elevated serum cholesterol    Assessment and Plan: * Chest pain Observation telemetry bed. EKG WNL. Troponin negative x 2 sets.  -D-dimer negative. -Due to multiple cardiac risk factors cardiology consulted and recommended cardiac CT for further evaluation.   -Fasting lipid panel with LDL of 78.  - Continue with ASA 81 mg daily, Cozaar, statin, PPI. -Patient noted with a bradycardia without AV nodal blocking agents. -Per cardiology.  Elevated serum cholesterol - Fasting lipid panel with LDL of 78.   -Continue pravastatin.   Hypertension Continue home regimen Norvasc 10 mg daily, losartan 100 mg daily.   Diabetes mellitus without complication (HCC) Hemoglobin A1c 7.0.   -Hold oral hypoglycemic agents.   -SSI.           DVT prophylaxis: Heparin. Code Status: Full Family Communication: Updated patient.  No family at bedside. Disposition: Home when cleared by cardiology hopefully in the next 24 hours.  Status is: Observation The patient remains OBS appropriate and will d/c before 2 midnights.   Consultants:  Cardiology: Dr. Servando Salina 07/19/2022  Procedures:  2D echo pending 07/19/2022 Chest x-ray 07/18/2022 Cardiac CT pending 07/19/2022  Antimicrobials:  None   Subjective: Patient laying in bed.  Patient states has slight substernal chest pain not as significant as it was on admission.  Denies any shortness of breath.  No nausea or vomiting.  No abdominal pain.  Awaiting cardiac CT.  Objective: Vitals:   07/19/22 0132 07/19/22 0310 07/19/22 0756 07/19/22 1221  BP: (!) 147/84 110/65 123/81 120/83  Pulse: (!) 49 (!) 54 (!)  52 (!) 59  Resp: 16 13 16 16   Temp: 97.6 F (36.4 C) 97.6 F (36.4 C) 97.9 F (36.6 C) 97.7 F (36.5 C)  TempSrc: Oral Oral Oral Oral  SpO2: 96% 94% 92% 96%  Weight:      Height:       No intake or output data in the 24 hours ending 07/19/22 1559 Filed Weights   07/19/22 0128  Weight: 96.4 kg    Examination:  General exam: Appears calm and comfortable  Respiratory system: Clear to auscultation. Respiratory effort normal. Cardiovascular system: S1 & S2 heard, RRR. No JVD, murmurs, rubs, gallops or clicks. No pedal edema. Gastrointestinal system: Abdomen is nondistended, soft and nontender. No organomegaly or masses felt. Normal bowel sounds heard. Central nervous system: Alert and oriented. No focal neurological deficits. Extremities: Symmetric 5 x 5 power. Skin: No rashes, lesions or ulcers Psychiatry: Judgement and insight appear normal. Mood & affect appropriate.     Data Reviewed:   CBC: Recent Labs  Lab 07/18/22 1819 07/19/22 1006  WBC 6.4 5.9  NEUTROABS  --  2.6  HGB 13.8 13.1  HCT 42.4 41.0  MCV 80.6 81.7  PLT 253 250    Basic Metabolic Panel: Recent Labs  Lab 07/18/22 1819 07/19/22 1006  NA 139 138  K 3.8 4.2  CL 105 105  CO2 26 24  GLUCOSE 107* 120*  BUN 17 19  CREATININE 0.90 0.98  CALCIUM 9.5 8.8*    GFR: Estimated Creatinine Clearance: 89.3 mL/min (by C-G formula based  on SCr of 0.98 mg/dL).  Liver Function Tests: No results for input(s): "AST", "ALT", "ALKPHOS", "BILITOT", "PROT", "ALBUMIN" in the last 168 hours.  CBG: No results for input(s): "GLUCAP" in the last 168 hours.   No results found for this or any previous visit (from the past 240 hour(s)).       Radiology Studies: CT CORONARY MORPH W/CTA COR W/SCORE W/CA W/CM &/OR WO/CM  Result Date: 07/19/2022 CLINICAL DATA:  56 year old with chest pain. EXAM: Cardiac/Coronary  CTA TECHNIQUE: The patient was scanned on a [employer]. FINDINGS: A 90 kV prospective scan  was triggered in the descending thoracic aorta at 111 HU's. Axial non-contrast 3 mm slices were carried out through the heart. The data set was analyzed on a dedicated work station and scored using the Agatson method. Gantry rotation speed was 250 msecs and collimation was .6 mm. 0.8 mg of sl NTG was given. The 3D data set was reconstructed in 5% intervals of the 67-82 % of the R-R cycle. Diastolic phases were analyzed on a dedicated work station using MPR, MIP and VRT modes. The patient received 80 cc of contrast. Image quality: good Aorta:  Normal size.  No calcifications.  No dissection. Aortic Valve: No calcifications. Coronary Arteries:  Normal coronary origin.  Right dominance. RCA is a large dominant artery that gives rise to PDA and PLA. There are mild distal luminal irregularities. No calcified plaque. Left main is a large artery that gives rise to LAD and LCX arteries. LAD is a large vessel that has mild mid to distal stenosis 30-49%. No calcified plaque. Large ramus branch with no plaque. LCX is a non-dominant small caliber artery that gives rise to one large OM1 branch. There is no plaque. Other findings: Normal pulmonary vein drainage into the left atrium. Normal left atrial appendage without a thrombus. Normal size of the pulmonary artery. Please see radiology report for non cardiac findings. IMPRESSION: 1. Coronary calcium score of 0. 2. Normal coronary origin with right dominance. 3. LAD is a large vessel that has mild mid to distal stenosis 30-49%. Non flow limiting. Electronically Signed   By: Donato Schultz M.D.   On: 07/19/2022 14:09   DG Chest 2 View  Result Date: 07/18/2022 CLINICAL DATA:  Chest pain.  Mild shortness of breath. EXAM: CHEST - 2 VIEW COMPARISON:  09/09/2017 FINDINGS: Right hemidiaphragm eventration anteriorly. Midline trachea. Normal heart size and mediastinal contours. No pleural effusion or pneumothorax. Clear lungs. IMPRESSION: No acute cardiopulmonary disease.  Electronically Signed   By: Jeronimo Greaves M.D.   On: 07/18/2022 18:50        Scheduled Meds:  amLODipine  10 mg Oral Daily   aspirin EC  81 mg Oral Daily   heparin  5,000 Units Subcutaneous Q8H   losartan  100 mg Oral Daily   nitroGLYCERIN       pantoprazole  40 mg Oral Q0600   pravastatin  40 mg Oral Daily   Continuous Infusions:   LOS: 0 days    Time spent: 35 minutes    Ramiro Harvest, MD [facility]   To contact the attending provider between 7A-7P or the covering provider during after hours 7P-7A, please log into the web site [email or web address] and access using universal [place] password for that web site. If you do not have the password, please call the hospital operator.  07/19/2022, 3:59 PM

## 2022-07-20 ENCOUNTER — Other Ambulatory Visit (HOSPITAL_COMMUNITY): Payer: Self-pay

## 2022-07-20 DIAGNOSIS — E789 Disorder of lipoprotein metabolism, unspecified: Secondary | ICD-10-CM | POA: Diagnosis not present

## 2022-07-20 DIAGNOSIS — R072 Precordial pain: Secondary | ICD-10-CM | POA: Diagnosis not present

## 2022-07-20 DIAGNOSIS — R079 Chest pain, unspecified: Secondary | ICD-10-CM | POA: Diagnosis not present

## 2022-07-20 DIAGNOSIS — E119 Type 2 diabetes mellitus without complications: Secondary | ICD-10-CM | POA: Diagnosis not present

## 2022-07-20 DIAGNOSIS — I10 Essential (primary) hypertension: Secondary | ICD-10-CM | POA: Diagnosis not present

## 2022-07-20 LAB — CBC
HCT: 40 % (ref 39.0–52.0)
Hemoglobin: 13.2 g/dL (ref 13.0–17.0)
MCH: 26.6 pg (ref 26.0–34.0)
MCHC: 33 g/dL (ref 30.0–36.0)
MCV: 80.5 fL (ref 80.0–100.0)
Platelets: 220 10*3/uL (ref 150–400)
RBC: 4.97 MIL/uL (ref 4.22–5.81)
RDW: 14.7 % (ref 11.5–15.5)
WBC: 6.1 10*3/uL (ref 4.0–10.5)
nRBC: 0 % (ref 0.0–0.2)

## 2022-07-20 LAB — BASIC METABOLIC PANEL
Anion gap: 8 (ref 5–15)
BUN: 21 mg/dL — ABNORMAL HIGH (ref 6–20)
CO2: 23 mmol/L (ref 22–32)
Calcium: 8.7 mg/dL — ABNORMAL LOW (ref 8.9–10.3)
Chloride: 108 mmol/L (ref 98–111)
Creatinine, Ser: 1.08 mg/dL (ref 0.61–1.24)
GFR, Estimated: >60 mL/min (ref >60)
Glucose, Bld: 113 mg/dL — ABNORMAL HIGH (ref 70–99)
Potassium: 4 mmol/L (ref 3.5–5.1)
Sodium: 139 mmol/L (ref 135–145)

## 2022-07-20 MED ORDER — ROSUVASTATIN CALCIUM 5 MG PO TABS
5.0000 mg | ORAL_TABLET | Freq: Every day | ORAL | 1 refills | Status: DC
  Filled 2022-07-20: qty 30, 30d supply, fill #0

## 2022-07-20 MED ORDER — PANTOPRAZOLE SODIUM 40 MG PO TBEC
40.0000 mg | DELAYED_RELEASE_TABLET | Freq: Every day | ORAL | 1 refills | Status: AC
  Filled 2022-07-20: qty 30, 30d supply, fill #0

## 2022-07-20 MED ORDER — TRAMADOL HCL 50 MG PO TABS: 50.0000 mg | ORAL_TABLET | Freq: Four times a day (QID) | ORAL | Status: DC | PRN

## 2022-07-20 MED ORDER — IBUPROFEN 600 MG PO TABS
600.0000 mg | ORAL_TABLET | Freq: Three times a day (TID) | ORAL | Status: DC
  Administered 2022-07-20: 600 mg via ORAL
  Filled 2022-07-20: qty 1

## 2022-07-20 MED ORDER — ROSUVASTATIN CALCIUM 10 MG PO TABS
10.0000 mg | ORAL_TABLET | Freq: Every day | ORAL | 1 refills | Status: DC
  Filled 2022-07-20: qty 30, 30d supply, fill #0

## 2022-07-20 MED ORDER — ASPIRIN 81 MG PO TBEC
81.0000 mg | DELAYED_RELEASE_TABLET | Freq: Every day | ORAL | 12 refills | Status: DC
  Filled 2022-07-20: qty 30, 30d supply, fill #0

## 2022-07-20 MED ORDER — IBUPROFEN 600 MG PO TABS
600.0000 mg | ORAL_TABLET | Freq: Three times a day (TID) | ORAL | 0 refills | Status: AC
  Filled 2022-07-20: qty 15, 5d supply, fill #0

## 2022-07-20 NOTE — Progress Notes (Signed)
Discharge instructions (including medications) discussed with and copy provided to patient/caregiver 

## 2022-07-20 NOTE — Progress Notes (Signed)
Rounding Note    Patient Name: Adam Mueller Date of Encounter: 07/20/2022  [facility] Cardiologist: None   Subjective   Patient seen and examined at his bedside. Wife at bedside.  Inpatient Medications    Scheduled Meds:  amLODipine  10 mg Oral Daily   aspirin EC  81 mg Oral Daily   heparin  5,000 Units Subcutaneous Q8H   ibuprofen  600 mg Oral TID   losartan  100 mg Oral Daily   pantoprazole  40 mg Oral Q0600   pravastatin  40 mg Oral Daily   Continuous Infusions:  PRN Meds: acetaminophen **OR** acetaminophen, nitroGLYCERIN, ondansetron **OR** ondansetron (ZOFRAN) IV, traMADol   Vital Signs    Vitals:   07/19/22 1942 07/20/22 0033 07/20/22 0420 07/20/22 0745  BP: 129/79 127/76 126/81 127/86  Pulse: 62 (!) 59 (!) 55 67  Resp: 16 15 14 14   Temp: 97.7 F (36.5 C) (!) 97.5 F (36.4 C) (!) 97.5 F (36.4 C) 97.8 F (36.6 C)  TempSrc: Oral Oral Oral Oral  SpO2: 90% 94% 94% 97%  Weight:      Height:       No intake or output data in the 24 hours ending 07/20/22 1529    07/19/2022    1:28 AM 05/15/2018    1:00 PM 05/06/2018    8:35 AM  Last 3 Weights  Weight (lbs) 212 lb 9.6 oz 225 lb 235 lb  Weight (kg) 96.435 kg 102.059 kg 106.595 kg      Telemetry    Sinus rhythm with occasional PVCs - Personally Reviewed  ECG     None today - Personally Reviewed  Physical Exam   GEN: No acute distress.   Neck: No JVD Cardiac: RRR, no murmurs, rubs, or gallops.  Respiratory: Clear to auscultation bilaterally. GI: Soft, nontender, non-distended  MS: No edema; No deformity. Neuro:  Nonfocal  Psych: Normal affect   Labs    High Sensitivity Troponin:   Recent Labs  Lab 07/18/22 1819 07/18/22 2027  TROPONINIHS 9 8     Chemistry Recent Labs  Lab 07/18/22 1819 07/19/22 1006 07/20/22 0319  NA 139 138 139  K 3.8 4.2 4.0  CL 105 105 108  CO2 26 24 23   GLUCOSE 107* 120* 113*  BUN 17 19 21*  CREATININE 0.90 0.98 1.08  CALCIUM 9.5 8.8* 8.7*   GFRNONAA >60 >60 >60  ANIONGAP 8 9 8     Lipids  Recent Labs  Lab 07/19/22 0600  CHOL 133  TRIG 121  HDL 31*  LDLCALC 78  CHOLHDL 4.3    Hematology Recent Labs  Lab 07/18/22 1819 07/19/22 1006 07/20/22 0319  WBC 6.4 5.9 6.1  RBC 5.26 5.02 4.97  HGB 13.8 13.1 13.2  HCT 42.4 41.0 40.0  MCV 80.6 81.7 80.5  MCH 26.2 26.1 26.6  MCHC 32.5 32.0 33.0  RDW 14.7 14.8 14.7  PLT 253 250 220   Thyroid  Recent Labs  Lab 07/19/22 1006  TSH 0.622    BNPNo results for input(s): "BNP", "PROBNP" in the last 168 hours.  DDimer  Recent Labs  Lab 07/19/22 1036  DDIMER 0.31     Radiology    ECHOCARDIOGRAM COMPLETE  Result Date: 07/19/2022    ECHOCARDIOGRAM REPORT   Patient Name:   DEARIUS HOFFMANN Date of Exam: 07/19/2022 Medical Rec #:  07/21/2022     Height:       66.0 in Accession #:    [name]  Weight:       212.6 lb Date of Birth:  Nov 15, 1965     BSA:          2.052 m Patient Age:    56 years      BP:           120/83 mmHg Patient Gender: M             HR:           51 bpm. Exam Location:  Inpatient Procedure: 2D Echo, 3D Echo, Cardiac Doppler, Color Doppler and Strain Analysis Indications:    R07.9* Chest pain, unspecified  History:        Patient has no prior history of Echocardiogram examinations.                 Signs/Symptoms:Chest Pain; Risk Factors:Diabetes and                 Hypertension.  Sonographer:    Sheralyn Boatman RDCS Referring Phys: [id number] DANIEL V THOMPSON  Sonographer Comments: Technically difficult study due to poor echo windows. Image acquisition challenging due to respiratory motion. Global longitudinal strain was attempted. IMPRESSIONS  1. Left ventricular ejection fraction, by estimation, is >75%. The left ventricle has hyperdynamic function. The left ventricle has no regional wall motion abnormalities. There is mild left ventricular hypertrophy. Left ventricular diastolic parameters are consistent with Grade I diastolic dysfunction (impaired relaxation).  2. Right  ventricular systolic function is normal. The right ventricular size is normal. Tricuspid regurgitation signal is inadequate for assessing PA pressure.  3. The mitral valve is abnormal. Trivial mitral valve regurgitation.  4. The aortic valve is tricuspid. Aortic valve regurgitation is not visualized.  5. The inferior vena cava is dilated in size with >50% respiratory variability, suggesting right atrial pressure of 8 mmHg. Comparison(s): No prior Echocardiogram. FINDINGS  Left Ventricle: Left ventricular ejection fraction, by estimation, is >75%. The left ventricle has hyperdynamic function. The left ventricle has no regional wall motion abnormalities. The left ventricular internal cavity size was normal in size. There is mild left ventricular hypertrophy. Left ventricular diastolic parameters are consistent with Grade I diastolic dysfunction (impaired relaxation). Normal left ventricular filling pressure. Right Ventricle: The right ventricular size is normal. No increase in right ventricular wall thickness. Right ventricular systolic function is normal. Tricuspid regurgitation signal is inadequate for assessing PA pressure. Left Atrium: Left atrial size was normal in size. Right Atrium: Right atrial size was normal in size. Pericardium: There is no evidence of pericardial effusion. Mitral Valve: The mitral valve is abnormal. There is mild calcification of the anterior and posterior mitral valve leaflet(s). Mild mitral annular calcification. Trivial mitral valve regurgitation. Tricuspid Valve: The tricuspid valve is grossly normal. Tricuspid valve regurgitation is trivial. Aortic Valve: The aortic valve is tricuspid. Aortic valve regurgitation is not visualized. Pulmonic Valve: The pulmonic valve was grossly normal. Pulmonic valve regurgitation is trivial. Aorta: The aortic root and ascending aorta are structurally normal, with no evidence of dilitation. Venous: The inferior vena cava is dilated in size with greater  than 50% respiratory variability, suggesting right atrial pressure of 8 mmHg. IAS/Shunts: No atrial level shunt detected by color flow Doppler.  LEFT VENTRICLE PLAX 2D LVIDd:         4.95 cm      Diastology LVIDs:         2.70 cm      LV e' medial:    5.11 cm/s LV PW:  1.25 cm      LV E/e' medial:  15.3 LV IVS:        1.10 cm      LV e' lateral:   3.92 cm/s LVOT diam:     2.10 cm      LV E/e' lateral: 19.9 LV SV:         95 LV SV Index:   46 LVOT Area:     3.46 cm                              3D Volume EF: LV Volumes (MOD)            3D EF:        60 % LV vol d, MOD A2C: 116.0 ml LV EDV:       195 ml LV vol d, MOD A4C: 129.0 ml LV ESV:       78 ml LV vol s, MOD A2C: 33.4 ml  LV SV:        116 ml LV vol s, MOD A4C: 37.1 ml LV SV MOD A2C:     82.6 ml LV SV MOD A4C:     129.0 ml LV SV MOD BP:      89.1 ml RIGHT VENTRICLE             IVC RV S prime:     13.30 cm/s  IVC diam: 2.10 cm TAPSE (M-mode): 1.6 cm LEFT ATRIUM             Index        RIGHT ATRIUM           Index LA Vol (A2C):   41.4 ml 20.17 ml/m  RA Area:     13.60 cm LA Vol (A4C):   59.5 ml 28.99 ml/m  RA Volume:   30.90 ml  15.06 ml/m LA Biplane Vol: 50.6 ml 24.65 ml/m  AORTIC VALVE LVOT Vmax:   130.00 cm/s LVOT Vmean:  81.900 cm/s LVOT VTI:    0.273 m  AORTA Ao Root diam: 3.35 cm Ao Asc diam:  3.70 cm MITRAL VALVE MV Area (PHT): 2.69 cm     SHUNTS MV Decel Time: 282 msec     Systemic VTI:  0.27 m MV E velocity: 78.10 cm/s   Systemic Diam: 2.10 cm MV A velocity: 121.00 cm/s MV E/A ratio:  0.65 Zoila ShutterKenneth Hilty MD Electronically signed by Zoila ShutterKenneth Hilty MD Signature Date/Time: 07/19/2022/4:56:16 PM    Final    CT CORONARY MORPH W/CTA COR W/SCORE Vallarie MareW/CA W/CM &/OR WO/CM  Addendum Date: 07/19/2022   ADDENDUM REPORT: 07/19/2022 15:59 EXAM: OVER-READ INTERPRETATION  CT CHEST The following report is a limited chest CT over-read performed by radiologist Dr. Irma NewnessWilliam Veazeyof [facility] on 07/19/2022. This over-read does not include  interpretation of cardiac or coronary anatomy or pathology. The coronary CTA interpretation by the cardiologist is attached. COMPARISON:  Chest radiographs 07/18/2022.  Abdominal CT 05/06/2018. FINDINGS: Mediastinum/Nodes: No enlarged lymph nodes within the visualized mediastinum. Lungs/Pleura: There is no pleural effusion. The visualized lungs appear clear. Upper abdomen: No significant findings in the visualized upper abdomen. Musculoskeletal/Chest wall: No chest wall mass or suspicious osseous findings within the visualized chest. IMPRESSION: No significant extracardiac findings within the visualized chest. Electronically Signed   By: Carey BullocksWilliam  Veazey M.D.   On: 07/19/2022 15:59   Result Date: 07/19/2022 CLINICAL DATA:  56 year old with chest pain. EXAM: Cardiac/Coronary  CTA TECHNIQUE: The patient was scanned on a [employer]. FINDINGS: A 90 kV prospective scan was triggered in the descending thoracic aorta at 111 HU's. Axial non-contrast 3 mm slices were carried out through the heart. The data set was analyzed on a dedicated work station and scored using the Agatson method. Gantry rotation speed was 250 msecs and collimation was .6 mm. 0.8 mg of sl NTG was given. The 3D data set was reconstructed in 5% intervals of the 67-82 % of the R-R cycle. Diastolic phases were analyzed on a dedicated work station using MPR, MIP and VRT modes. The patient received 80 cc of contrast. Image quality: good Aorta:  Normal size.  No calcifications.  No dissection. Aortic Valve: No calcifications. Coronary Arteries:  Normal coronary origin.  Right dominance. RCA is a large dominant artery that gives rise to PDA and PLA. There are mild distal luminal irregularities. No calcified plaque. Left main is a large artery that gives rise to LAD and LCX arteries. LAD is a large vessel that has mild mid to distal stenosis 30-49%. No calcified plaque. Large ramus branch with no plaque. LCX is a non-dominant small caliber artery  that gives rise to one large OM1 branch. There is no plaque. Other findings: Normal pulmonary vein drainage into the left atrium. Normal left atrial appendage without a thrombus. Normal size of the pulmonary artery. Please see radiology report for non cardiac findings. IMPRESSION: 1. Coronary calcium score of 0. 2. Normal coronary origin with right dominance. 3. LAD is a large vessel that has mild mid to distal stenosis 30-49%. Non flow limiting. Electronically Signed: By: Donato Schultz M.D. On: 07/19/2022 14:09   DG Chest 2 View  Result Date: 07/18/2022 CLINICAL DATA:  Chest pain.  Mild shortness of breath. EXAM: CHEST - 2 VIEW COMPARISON:  09/09/2017 FINDINGS: Right hemidiaphragm eventration anteriorly. Midline trachea. Normal heart size and mediastinal contours. No pleural effusion or pneumothorax. Clear lungs. IMPRESSION: No acute cardiopulmonary disease. Electronically Signed   By: Jeronimo Greaves M.D.   On: 07/18/2022 18:50    Cardiac Studies   CCTA 07/20/2022 Image quality: good   Aorta:  Normal size.  No calcifications.  No dissection.   Aortic Valve: No calcifications.   Coronary Arteries:  Normal coronary origin.  Right dominance.   RCA is a large dominant artery that gives rise to PDA and PLA. There are mild distal luminal irregularities. No calcified plaque.   Left main is a large artery that gives rise to LAD and LCX arteries.   LAD is a large vessel that has mild mid to distal stenosis 30-49%. No calcified plaque.   Large ramus branch with no plaque.   LCX is a non-dominant small caliber artery that gives rise to one large OM1 branch. There is no plaque.   Other findings:   Normal pulmonary vein drainage into the left atrium.   Normal left atrial appendage without a thrombus.   Normal size of the pulmonary artery.   Please see radiology report for non cardiac findings.   IMPRESSION: 1. Coronary calcium score of 0.   2. Normal coronary origin with right dominance.    3. LAD is a large vessel that has mild mid to distal stenosis 30-49%. Non flow limiting.   Electronically Signed: By: Donato Schultz M.D. On: 07/19/2022 14:09  TTE 07/20/2022 IMPRESSIONS     1. Left ventricular ejection fraction, by estimation, is >75%. The left  ventricle has hyperdynamic function. The left ventricle  has no regional  wall motion abnormalities. There is mild left ventricular hypertrophy.  Left ventricular diastolic parameters  are consistent with Grade I diastolic dysfunction (impaired relaxation).   2. Right ventricular systolic function is normal. The right ventricular  size is normal. Tricuspid regurgitation signal is inadequate for assessing  PA pressure.   3. The mitral valve is abnormal. Trivial mitral valve regurgitation.   4. The aortic valve is tricuspid. Aortic valve regurgitation is not  visualized.   5. The inferior vena cava is dilated in size with >50% respiratory  variability, suggesting right atrial pressure of 8 mmHg.   Comparison(s): No prior Echocardiogram.   FINDINGS   Left Ventricle: Left ventricular ejection fraction, by estimation, is  >75%. The left ventricle has hyperdynamic function. The left ventricle has  no regional wall motion abnormalities. The left ventricular internal  cavity size was normal in size. There  is mild left ventricular hypertrophy. Left ventricular diastolic  parameters are consistent with Grade I diastolic dysfunction (impaired  relaxation). Normal left ventricular filling pressure.   Right Ventricle: The right ventricular size is normal. No increase in  right ventricular wall thickness. Right ventricular systolic function is  normal. Tricuspid regurgitation signal is inadequate for assessing PA  pressure.   Left Atrium: Left atrial size was normal in size.   Right Atrium: Right atrial size was normal in size.   Pericardium: There is no evidence of pericardial effusion.   Mitral Valve: The mitral valve is  abnormal. There is mild calcification of  the anterior and posterior mitral valve leaflet(s). Mild mitral annular  calcification. Trivial mitral valve regurgitation.   Tricuspid Valve: The tricuspid valve is grossly normal. Tricuspid valve  regurgitation is trivial.   Aortic Valve: The aortic valve is tricuspid. Aortic valve regurgitation is  not visualized.   Pulmonic Valve: The pulmonic valve was grossly normal. Pulmonic valve  regurgitation is trivial.   Aorta: The aortic root and ascending aorta are structurally normal, with  no evidence of dilitation.   Venous: The inferior vena cava is dilated in size with greater than 50%  respiratory variability, suggesting right atrial pressure of 8 mmHg.   IAS/Shunts: No atrial level shunt detected by color flow Doppler.    Patient Profile     56 y.o. male with chest pain.  Assessment & Plan    Mild CAD Occasional PAC Hypertension  Hyperlipidemia   No angina symptoms. His CCTA showed evidence of mild coronary artery disease. He is on Aspirin 81mg  daily. Please continue and transition from pravastatin to crestor 10mg  daily.   PVC - will need to wear a monitor in outpatient to understand his PVC burden. Hypertension - continue current regimen  [facility] will sign off.   Medication Recommendations: Aspirin 81 mg daily, Crestor 10 mg daily, amlodipine 10 mg daily and losartan 100 mg daily. Other recommendations (labs, testing, etc): May need outpatient ambulatory monitor for PVC. Follow up as an outpatient: Routine cardiology follow-up, patient's wife reports that their PCP will referred him to cardiology.  For questions or updates, please contact [facility] Please consult [email or web address] for contact info under        Signed, , DO  07/20/2022, 3:29 PM

## 2022-07-20 NOTE — Discharge Summary (Signed)
Physician Discharge Summary  IBN STIEF RSW:[id number] DOB: 03/25/1966 DOA: 07/18/2022  PCP: Johny Blamer, MD  Admit date: 07/18/2022 Discharge date: 07/20/2022  Time spent: 55 minutes  Recommendations for Outpatient Follow-up:  Follow-up with Johny Blamer, MD in 1 to 2 weeks.  On follow-up patient chest pain need to be reassessed.  Patient will need a basic metabolic profile done to follow-up on electrolytes and renal function.  Per cardiology note wife stated PCP to make referral to cardiology in the outpatient setting for further evaluation and follow-up.  Per cardiology patient may need outpatient ambulatory monitor for PVC.   Discharge Diagnoses:  Principal Problem:   Chest pain Active Problems:   Diabetes mellitus without complication (HCC)   Hypertension   Elevated serum cholesterol   Discharge Condition: Stable and improved  Diet recommendation: Heart healthy  Filed Weights   07/19/22 0128  Weight: 96.4 kg    History of present illness:  HPI per Dr. Imogene Burn 56 yo AAM with hx of HTN, DM2, hyperlipidemia, presents to the ER with chest pain that started at work.  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  Slightly short of breath.  No nausea.  No diaphoresis.  Chest pain continued for about 2 hours.  He called his wife.  Wife instructed him to go to the ER.   Patient was still having substernal chest pain when he arrived to the ER.  Received 2 nitroglycerin sprays and chest pain resolved.  Cardiac markers negative x2.  EKG showed normal sinus rhythm.   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  He states that he has a walk across the whole apartment complex picking up trash outside of peoples apartments.  He has to walk up  and down stairs.  Does not get chest pain or shortness of breath with that.   [facility] care.  Hospital Course:   Assessment and Plan: * Chest pain Patient admitted for work-up of chest pain.  EKG done with no ischemic changes noted.  -High-sensitivity troponin obtained was negative x2.   -D-dimer negative. -Due to multiple cardiac risk factors cardiology consulted and recommended cardiac CT for further evaluation.   -Fasting lipid panel with LDL of 78.  -Patient maintained on ASA 81 mg daily, Cozaar, statin, PPI. -Patient noted with a bradycardia without AV nodal blocking agents. -Patient underwent cardiac CTA that showed evidence of mild coronary artery disease.  2D echo with EF > 75%, NWMA, grade 1 diastolic dysfunction.  -Patient with clinical improvement, was cleared by cardiology for discharge and recommended that patient may need outpatient ambulatory monitor for PVC.   -Cardiology recommended changing patient's pravastatin to Crestor and patient continued on aspirin, Norvasc, losartan with routine cardiology follow-up.   -Per cardiology note patient's wife reported that PCP will refer patient to cardiology.   -Patient will be placed on scheduled ibuprofen 600 mg 3 times daily x5 days with close outpatient follow-up with PCP.  Elevated serum cholesterol - Fasting lipid panel with LDL of 78.   -Patient maintained on pravastatin during the hospitalization and recommended per cardiology to be changed to Crestor on discharge.   -Outpatient follow-up with PCP.   Hypertension Patient maintained on home  regimen Norvasc 10 mg daily, losartan 100 mg daily.   Diabetes mellitus without complication (HCC) Hemoglobin A1c 7.0.   -Oral hypoglycemic agents were held during the hospitalization and patient maintained on SSI.          Procedures: 2D echo 07/19/2022 Chest x-ray 07/18/2022 Cardiac CT 07/19/2022  Consultations: Cardiology: Dr. Servando Salina  07/19/2022  Discharge Exam: Vitals:   07/20/22 0420 07/20/22 0745  BP: 126/81 127/86  Pulse: (!) 55 67  Resp: 14 14  Temp: (!) 97.5 F (36.4 C) 97.8 F (36.6 C)  SpO2: 94% 97%    General: NAD Cardiovascular: Regular rate rhythm no murmurs rubs or gallops.  No JVD.  No lower extremity edema. Respiratory: CTA B.  No wheezes, no crackles, no rhonchi.  Discharge Instructions   Discharge Instructions     Diet - low sodium heart healthy   Complete by: As directed    Increase activity slowly   Complete by: As directed    Increase activity slowly   Complete by: As directed       Allergies as of 07/20/2022   No Known Allergies      Medication List     STOP taking these medications    pravastatin 40 MG tablet Commonly known as: PRAVACHOL       TAKE these medications    amLODipine 10 MG tablet Commonly known as: NORVASC Take 10 mg by mouth daily. Notes to patient: Start on 07/21/22 in the morning   Aspirin Low Dose 81 MG tablet Generic drug: aspirin EC Take 1 tablet (81 mg total) by mouth daily. Swallow whole. Start taking on: July 21, 2022   ibuprofen 600 MG tablet Commonly known as: ADVIL Take 1 tablet (600 mg total) by mouth 3 (three) times daily for 5 days. Notes to patient: Start in the evening on 07/20/22   losartan 100 MG tablet Commonly known as: COZAAR Take 100 mg by mouth daily. Notes to patient: Start in the morning on 07/21/22   metFORMIN 500 MG tablet Commonly known as: GLUCOPHAGE Take 500 mg by mouth 2 (two) times daily. Notes to patient: Start in the evening on 07/20/22   pantoprazole 40 MG tablet Commonly known as: PROTONIX Take 1 tablet (40 mg total) by mouth daily at 6 (six) AM. Start taking on: July 21, 2022   rosuvastatin 10 MG tablet Commonly known as: Crestor Take 1 tablet (10 mg total) by mouth daily. Notes to patient: Start in the morning on 07/21/22       No Known Allergies  Follow-up Information     Johny Blamer, MD. Schedule an appointment as soon as possible for a visit in 1 week(s).   Specialty: Family Medicine Why: Follow-up in 1 to 2 weeks. Contact information: [address] [place] [address] [phone]                  The results of significant diagnostics from this hospitalization (including imaging, microbiology, ancillary and laboratory) are listed below for reference.    Significant Diagnostic Studies: ECHOCARDIOGRAM COMPLETE  Result Date: 07/19/2022    ECHOCARDIOGRAM REPORT   Patient Name:   Adam Mueller Date of Exam: 07/19/2022 Medical Rec #:  [id number]     Height:       66.0 in Accession #:    [id number]    Weight:       212.6 lb Date of Birth:  Nov 22, 1965     BSA:  2.052 m Patient Age:    56 years      BP:           120/83 mmHg Patient Gender: M             HR:           51 bpm. Exam Location:  Inpatient Procedure: 2D Echo, 3D Echo, Cardiac Doppler, Color Doppler and Strain Analysis Indications:    R07.9* Chest pain, unspecified  History:        Patient has no prior history of Echocardiogram examinations.                 Signs/Symptoms:Chest Pain; Risk Factors:Diabetes and                 Hypertension.  Sonographer:    Sheralyn Boatman RDCS Referring Phys: [id number] Neha Waight V Armandina Iman  Sonographer Comments: Technically difficult study due to poor echo windows. Image acquisition challenging due to respiratory motion. Global longitudinal strain was attempted. IMPRESSIONS  1. Left ventricular ejection fraction, by estimation, is >75%. The left ventricle has hyperdynamic function. The left ventricle has no regional wall motion abnormalities. There is mild left ventricular hypertrophy. Left ventricular diastolic parameters are consistent with Grade I diastolic dysfunction (impaired relaxation).  2. Right ventricular systolic function is normal. The right ventricular size is normal. Tricuspid regurgitation signal is inadequate for assessing PA pressure.  3. The mitral  valve is abnormal. Trivial mitral valve regurgitation.  4. The aortic valve is tricuspid. Aortic valve regurgitation is not visualized.  5. The inferior vena cava is dilated in size with >50% respiratory variability, suggesting right atrial pressure of 8 mmHg. Comparison(s): No prior Echocardiogram. FINDINGS  Left Ventricle: Left ventricular ejection fraction, by estimation, is >75%. The left ventricle has hyperdynamic function. The left ventricle has no regional wall motion abnormalities. The left ventricular internal cavity size was normal in size. There is mild left ventricular hypertrophy. Left ventricular diastolic parameters are consistent with Grade I diastolic dysfunction (impaired relaxation). Normal left ventricular filling pressure. Right Ventricle: The right ventricular size is normal. No increase in right ventricular wall thickness. Right ventricular systolic function is normal. Tricuspid regurgitation signal is inadequate for assessing PA pressure. Left Atrium: Left atrial size was normal in size. Right Atrium: Right atrial size was normal in size. Pericardium: There is no evidence of pericardial effusion. Mitral Valve: The mitral valve is abnormal. There is mild calcification of the anterior and posterior mitral valve leaflet(s). Mild mitral annular calcification. Trivial mitral valve regurgitation. Tricuspid Valve: The tricuspid valve is grossly normal. Tricuspid valve regurgitation is trivial. Aortic Valve: The aortic valve is tricuspid. Aortic valve regurgitation is not visualized. Pulmonic Valve: The pulmonic valve was grossly normal. Pulmonic valve regurgitation is trivial. Aorta: The aortic root and ascending aorta are structurally normal, with no evidence of dilitation. Venous: The inferior vena cava is dilated in size with greater than 50% respiratory variability, suggesting right atrial pressure of 8 mmHg. IAS/Shunts: No atrial level shunt detected by color flow Doppler.  LEFT VENTRICLE PLAX  2D LVIDd:         4.95 cm      Diastology LVIDs:         2.70 cm      LV e' medial:    5.11 cm/s LV PW:         1.25 cm      LV E/e' medial:  15.3 LV IVS:        1.10 cm  LV e' lateral:   3.92 cm/s LVOT diam:     2.10 cm      LV E/e' lateral: 19.9 LV SV:         95 LV SV Index:   46 LVOT Area:     3.46 cm                              3D Volume EF: LV Volumes (MOD)            3D EF:        60 % LV vol d, MOD A2C: 116.0 ml LV EDV:       195 ml LV vol d, MOD A4C: 129.0 ml LV ESV:       78 ml LV vol s, MOD A2C: 33.4 ml  LV SV:        116 ml LV vol s, MOD A4C: 37.1 ml LV SV MOD A2C:     82.6 ml LV SV MOD A4C:     129.0 ml LV SV MOD BP:      89.1 ml RIGHT VENTRICLE             IVC RV S prime:     13.30 cm/s  IVC diam: 2.10 cm TAPSE (M-mode): 1.6 cm LEFT ATRIUM             Index        RIGHT ATRIUM           Index LA Vol (A2C):   41.4 ml 20.17 ml/m  RA Area:     13.60 cm LA Vol (A4C):   59.5 ml 28.99 ml/m  RA Volume:   30.90 ml  15.06 ml/m LA Biplane Vol: 50.6 ml 24.65 ml/m  AORTIC VALVE LVOT Vmax:   130.00 cm/s LVOT Vmean:  81.900 cm/s LVOT VTI:    0.273 m  AORTA Ao Root diam: 3.35 cm Ao Asc diam:  3.70 cm MITRAL VALVE MV Area (PHT): 2.69 cm     SHUNTS MV Decel Time: 282 msec     Systemic VTI:  0.27 m MV E velocity: 78.10 cm/s   Systemic Diam: 2.10 cm MV A velocity: 121.00 cm/s MV E/A ratio:  0.65 Zoila Shutter MD Electronically signed by Zoila Shutter MD Signature Date/Time: 07/19/2022/4:56:16 PM    Final    CT CORONARY MORPH W/CTA COR W/SCORE Vallarie Mare W/CM &/OR WO/CM  Addendum Date: 07/19/2022   ADDENDUM REPORT: 07/19/2022 15:59 EXAM: OVER-READ INTERPRETATION  CT CHEST The following report is a limited chest CT over-read performed by radiologist Dr. Irma Newness [facility] on 07/19/2022. This over-read does not include interpretation of cardiac or coronary anatomy or pathology. The coronary CTA interpretation by the cardiologist is attached. COMPARISON:  Chest radiographs 07/18/2022.   Abdominal CT 05/06/2018. FINDINGS: Mediastinum/Nodes: No enlarged lymph nodes within the visualized mediastinum. Lungs/Pleura: There is no pleural effusion. The visualized lungs appear clear. Upper abdomen: No significant findings in the visualized upper abdomen. Musculoskeletal/Chest wall: No chest wall mass or suspicious osseous findings within the visualized chest. IMPRESSION: No significant extracardiac findings within the visualized chest. Electronically Signed   By: Carey Bullocks M.D.   On: 07/19/2022 15:59   Result Date: 07/19/2022 CLINICAL DATA:  56 year old with chest pain. EXAM: Cardiac/Coronary  CTA TECHNIQUE: The patient was scanned on a [employer]. FINDINGS: A 90 kV prospective scan was triggered in the descending thoracic aorta at 111 HU's.  Axial non-contrast 3 mm slices were carried out through the heart. The data set was analyzed on a dedicated work station and scored using the Agatson method. Gantry rotation speed was 250 msecs and collimation was .6 mm. 0.8 mg of sl NTG was given. The 3D data set was reconstructed in 5% intervals of the 67-82 % of the R-R cycle. Diastolic phases were analyzed on a dedicated work station using MPR, MIP and VRT modes. The patient received 80 cc of contrast. Image quality: good Aorta:  Normal size.  No calcifications.  No dissection. Aortic Valve: No calcifications. Coronary Arteries:  Normal coronary origin.  Right dominance. RCA is a large dominant artery that gives rise to PDA and PLA. There are mild distal luminal irregularities. No calcified plaque. Left main is a large artery that gives rise to LAD and LCX arteries. LAD is a large vessel that has mild mid to distal stenosis 30-49%. No calcified plaque. Large ramus branch with no plaque. LCX is a non-dominant small caliber artery that gives rise to one large OM1 branch. There is no plaque. Other findings: Normal pulmonary vein drainage into the left atrium. Normal left atrial appendage without a  thrombus. Normal size of the pulmonary artery. Please see radiology report for non cardiac findings. IMPRESSION: 1. Coronary calcium score of 0. 2. Normal coronary origin with right dominance. 3. LAD is a large vessel that has mild mid to distal stenosis 30-49%. Non flow limiting. Electronically Signed: By: Donato SchultzMark  Skains M.D. On: 07/19/2022 14:09   DG Chest 2 View  Result Date: 07/18/2022 CLINICAL DATA:  Chest pain.  Mild shortness of breath. EXAM: CHEST - 2 VIEW COMPARISON:  09/09/2017 FINDINGS: Right hemidiaphragm eventration anteriorly. Midline trachea. Normal heart size and mediastinal contours. No pleural effusion or pneumothorax. Clear lungs. IMPRESSION: No acute cardiopulmonary disease. Electronically Signed   By: Jeronimo GreavesKyle  Talbot M.D.   On: 07/18/2022 18:50    Microbiology: No results found for this or any previous visit (from the past 240 hour(s)).   Labs: Basic Metabolic Panel: Recent Labs  Lab 07/18/22 1819 07/19/22 1006 07/20/22 0319  NA 139 138 139  K 3.8 4.2 4.0  CL 105 105 108  CO2 26 24 23   GLUCOSE 107* 120* 113*  BUN 17 19 21*  CREATININE 0.90 0.98 1.08  CALCIUM 9.5 8.8* 8.7*   Liver Function Tests: No results for input(s): "AST", "ALT", "ALKPHOS", "BILITOT", "PROT", "ALBUMIN" in the last 168 hours. No results for input(s): "LIPASE", "AMYLASE" in the last 168 hours. No results for input(s): "AMMONIA" in the last 168 hours. CBC: Recent Labs  Lab 07/18/22 1819 07/19/22 1006 07/20/22 0319  WBC 6.4 5.9 6.1  NEUTROABS  --  2.6  --   HGB 13.8 13.1 13.2  HCT 42.4 41.0 40.0  MCV 80.6 81.7 80.5  PLT 253 250 220   Cardiac Enzymes: No results for input(s): "CKTOTAL", "CKMB", "CKMBINDEX", "TROPONINI" in the last 168 hours. BNP: BNP (last 3 results) No results for input(s): "BNP" in the last 8760 hours.  ProBNP (last 3 results) No results for input(s): "PROBNP" in the last 8760 hours.  CBG: No results for input(s): "GLUCAP" in the last 168  hours.     Signed:  Ramiro Harvestaniel Darius Lundberg MD.  [facility] 07/20/2022, 4:10 PM

## 2022-07-20 NOTE — Plan of Care (Signed)

## 2022-08-16 ENCOUNTER — Ambulatory Visit: Payer: No Typology Code available for payment source | Admitting: Cardiology

## 2022-08-16 ENCOUNTER — Encounter: Payer: Self-pay | Admitting: Cardiology

## 2022-08-16 VITALS — BP 130/84 | HR 74 | Temp 98.0°F | Resp 16 | Ht 66.0 in | Wt 222.0 lb

## 2022-08-16 DIAGNOSIS — I10 Essential (primary) hypertension: Secondary | ICD-10-CM

## 2022-08-16 DIAGNOSIS — R072 Precordial pain: Secondary | ICD-10-CM

## 2022-08-16 DIAGNOSIS — E782 Mixed hyperlipidemia: Secondary | ICD-10-CM

## 2022-08-16 MED ORDER — ROSUVASTATIN CALCIUM 10 MG PO TABS: 20.0000 mg | ORAL_TABLET | Freq: Every day | ORAL | 0 refills | Status: AC

## 2022-08-16 NOTE — Progress Notes (Signed)
Patient referred by Adam Blamer, MD for chest pain  Subjective:   NDREW Mueller, male    DOB: 1966-03-14, 56 y.o.   MRN: [id number]   Chief Complaint  Patient presents with   Coronary Artery Disease   New Patient (Initial Visit)   Chest Pain    HPI  56 year old African-American male with hypertension, type 2 diabetes mellitus, obesity, chest pain  Patient was hospitalized in 07/2022.  He was seen by [facility] heart care cardiologist Dr. Thomasene Ripple. CCTA showed calcium score 0, 30-49% mid to distal LAD non flow limiting stenosis. Patient stays busy walking at his work without any complaints of chest pain. Blood pressure is well controlled.     Past Medical History:  Diagnosis Date   Cholesterol serum elevated    Depression    Diabetes mellitus without complication (HCC)    boderline with no medication   History of kidney stones    Hypertension    Hypogonadism in male      Past Surgical History:  Procedure Laterality Date   EXTRACORPOREAL SHOCK WAVE LITHOTRIPSY Right 05/15/2018   Procedure: RIGHT EXTRACORPOREAL SHOCK WAVE LITHOTRIPSY (ESWL);  Surgeon: Sebastian Ache, MD;  Location: [facility] ORS;  Service: Urology;  Laterality: Right;   WISDOM TOOTH EXTRACTION       Social History   Tobacco Use  Smoking Status Never  Smokeless Tobacco Never    Social History   Substance and Sexual Activity  Alcohol Use No     Family History  Problem Relation Age of Onset   Diabetes Mother    Heart failure Mother       Current Outpatient Medications:    amLODipine (NORVASC) 10 MG tablet, Take 10 mg by mouth daily., Disp: , Rfl:    aspirin EC 81 MG tablet, Take 1 tablet (81 mg total) by mouth daily. Swallow whole., Disp: 30 tablet, Rfl: 12   losartan (COZAAR) 100 MG tablet, Take 100 mg by mouth daily.  , Disp: , Rfl:    metFORMIN (GLUCOPHAGE) 500 MG tablet, Take 500 mg by mouth 2 (two) times daily., Disp: , Rfl:    pantoprazole (PROTONIX) 40 MG tablet, Take 1 tablet (40 mg  total) by mouth daily at 6 (six) AM., Disp: 30 tablet, Rfl: 1   rosuvastatin (CRESTOR) 10 MG tablet, Take 1 tablet (10 mg total) by mouth daily., Disp: 30 tablet, Rfl: 1   Cardiovascular and other pertinent studies:  Reviewed external labs and tests, independently interpreted  EKG 08/16/2022: Sinus rhythm 65 bpm Poor R-wave progression Cannot exclude old anteroseptal infarct   CCTA 07/19/2022: 1. Coronary calcium score of 0. 2. Normal coronary origin with right dominance. 3. LAD is a large vessel that has mild mid to distal stenosis 30-49%. Non flow limiting.   Recent labs: 07/20/2022: Glucose 113, BUN/Cr 21/1.08. EGFR >60. Na/K 139/4.0.  H/H 13/40. MCV 80. Platelets 220 HbA1C 7.0% Chol 133, TG 121, HDL 31, LDL 78 TSH 0.6 normal   Review of Systems  Cardiovascular:  Negative for chest pain, dyspnea on exertion, leg swelling, palpitations and syncope.         Vitals:   08/16/22 1319  BP: 130/84  Pulse: 74  Resp: 16  Temp: 98 F (36.7 C)  SpO2: 95%     Body mass index is 35.83 kg/m. Filed Weights   08/16/22 1319  Weight: 222 lb (100.7 kg)     Objective:   Physical Exam Vitals and nursing note reviewed.  Constitutional:  General: He is not in acute distress. Neck:     Vascular: No JVD.  Cardiovascular:     Rate and Rhythm: Normal rate and regular rhythm.     Heart sounds: Normal heart sounds. No murmur heard. Pulmonary:     Effort: Pulmonary effort is normal.     Breath sounds: Normal breath sounds. No wheezing or rales.  Musculoskeletal:     Right lower leg: No edema.     Left lower leg: No edema.           Visit diagnoses:   ICD-10-CM   1. Precordial pain  R07.2 EKG 12-Lead    2. Primary hypertension  I10     3. Mixed hyperlipidemia  E78.2        Orders Placed This Encounter  Procedures   EKG 12-Lead     Medication changes this visit: Medications Discontinued During This Encounter  Medication Reason   aspirin EC 81 MG  tablet Discontinued by provider   rosuvastatin (CRESTOR) 10 MG tablet     Meds ordered this encounter  Medications   rosuvastatin (CRESTOR) 10 MG tablet    Sig: Take 2 tablets (20 mg total) by mouth daily.    Dispense:  1 tablet    Refill:  0     Assessment & Recommendations:    56 year old African-American male with hypertension, type 2 diabetes mellitus, obesity, chest pain  Chest pain: Now resolved. CCTA showed mild plaque <50% with calcium score 0.  No indication for Aspirin. Continue Crestor, increase to 20 mg daily.  Continue f/u w/PCP with LFL goal <70.  Hypertension: Well controlled  I will see him on as needed basis  Thank you for referring the patient to [place]. Please feel free to contact with any questions.   Nigel Mormon, MD Pager: [phone] Office: [phone]

## 2024-06-28 ENCOUNTER — Emergency Department (HOSPITAL_BASED_OUTPATIENT_CLINIC_OR_DEPARTMENT_OTHER)
Admission: EM | Admit: 2024-06-28 | Discharge: 2024-06-28 | Disposition: A | Discharge status: Home / Self Care | Attending: Emergency Medicine | Admitting: Emergency Medicine

## 2024-06-28 ENCOUNTER — Other Ambulatory Visit: Payer: Self-pay

## 2024-06-28 ENCOUNTER — Emergency Department (HOSPITAL_BASED_OUTPATIENT_CLINIC_OR_DEPARTMENT_OTHER)

## 2024-06-28 ENCOUNTER — Encounter (HOSPITAL_BASED_OUTPATIENT_CLINIC_OR_DEPARTMENT_OTHER): Payer: Self-pay | Admitting: Emergency Medicine

## 2024-06-28 DIAGNOSIS — R079 Chest pain, unspecified: Secondary | ICD-10-CM | POA: Diagnosis present

## 2024-06-28 LAB — CBC WITH DIFFERENTIAL/PLATELET
Abs Immature Granulocytes: 0.01 K/uL (ref 0.00–0.07)
Basophils Absolute: 0 K/uL (ref 0.0–0.1)
Basophils Relative: 1 %
Eosinophils Absolute: 0.2 K/uL (ref 0.0–0.5)
Eosinophils Relative: 4 %
HCT: 39.8 % (ref 39.0–52.0)
Hemoglobin: 12.9 g/dL — ABNORMAL LOW (ref 13.0–17.0)
Immature Granulocytes: 0 %
Lymphocytes Relative: 33 %
Lymphs Abs: 2.1 K/uL (ref 0.7–4.0)
MCH: 25.7 pg — ABNORMAL LOW (ref 26.0–34.0)
MCHC: 32.4 g/dL (ref 30.0–36.0)
MCV: 79.3 fL — ABNORMAL LOW (ref 80.0–100.0)
Monocytes Absolute: 0.7 K/uL (ref 0.1–1.0)
Monocytes Relative: 11 %
Neutro Abs: 3.3 K/uL (ref 1.7–7.7)
Neutrophils Relative %: 51 %
Platelets: 215 K/uL (ref 150–400)
RBC: 5.02 MIL/uL (ref 4.22–5.81)
RDW: 14.5 % (ref 11.5–15.5)
WBC: 6.2 K/uL (ref 4.0–10.5)
nRBC: 0 % (ref 0.0–0.2)

## 2024-06-28 LAB — BASIC METABOLIC PANEL WITH GFR
Anion gap: 11 (ref 5–15)
BUN: 13 mg/dL (ref 6–20)
CO2: 23 mmol/L (ref 22–32)
Calcium: 8.9 mg/dL (ref 8.9–10.3)
Chloride: 108 mmol/L (ref 98–111)
Creatinine, Ser: 0.88 mg/dL (ref 0.61–1.24)
GFR, Estimated: >60 mL/min (ref >60)
Glucose, Bld: 121 mg/dL — ABNORMAL HIGH (ref 70–99)
Potassium: 3.9 mmol/L (ref 3.5–5.1)
Sodium: 142 mmol/L (ref 135–145)

## 2024-06-28 LAB — TROPONIN T, HIGH SENSITIVITY
Troponin T High Sensitivity: <15 ng/L (ref 0–19)
Troponin T High Sensitivity: <15 ng/L (ref 0–19)

## 2024-06-28 NOTE — ED Provider Notes (Signed)
 [place] [facility] Provider Note   CSN: [id number] Arrival date & time: 06/28/24  1528     Patient presents with: Chest Pain   Adam Mueller is a 59 y.o. male.   Patient to ED from home for evaluation of chest pain that started this morning while dressing to go to church. He describes nonradiating pain across the center of his chest that comes and goes. He endorses SOB. No recent cough or fever. He cannot identify any modifying factors.   The history is provided by the patient and the spouse. No language interpreter was used.  Chest Pain      Prior to Admission medications   Medication Sig Start Date End Date Taking? Authorizing Provider  amLODipine  (NORVASC ) 10 MG tablet Take 10 mg by mouth daily.    [provider]  losartan  (COZAAR ) 100 MG tablet Take 100 mg by mouth daily.      [provider]  metFORMIN (GLUCOPHAGE) 500 MG tablet Take 500 mg by mouth 2 (two) times daily. 03/30/22   [provider]  pantoprazole  (PROTONIX ) 40 MG tablet Take 1 tablet (40 mg total) by mouth daily at 6 (six) AM. 07/21/22   Sebastian Toribio GAILS, MD  rosuvastatin  (CRESTOR ) 10 MG tablet Take 2 tablets (20 mg total) by mouth daily. 08/16/22 08/16/23  Patwardhan, Newman PARAS, MD  sildenafil (VIAGRA) 100 MG tablet Take 100 mg by mouth daily as needed for erectile dysfunction.    [provider]    Allergies: Patient has no known allergies.    Review of Systems  Cardiovascular:  Positive for chest pain.    Updated Vital Signs BP 139/88   Pulse (!) 57   Temp 97.8 F (36.6 C) (Oral)   Resp 15   Ht 5' 6 (1.676 m)   Wt 102.1 kg   SpO2 95%   BMI 36.32 kg/m   Physical Exam Vitals and nursing note reviewed.  Constitutional:      Appearance: He is well-developed.  HENT:     Head: Normocephalic.     Mouth/Throat:     Mouth: Mucous membranes are moist.  Cardiovascular:     Rate and Rhythm: Normal rate and regular rhythm.      Heart sounds: No murmur heard. Pulmonary:     Effort: Pulmonary effort is normal.     Breath sounds: Normal breath sounds. No wheezing, rhonchi or rales.  Abdominal:     General: Bowel sounds are normal.     Palpations: Abdomen is soft.     Tenderness: There is no abdominal tenderness. There is no guarding or rebound.  Musculoskeletal:        General: Normal range of motion.     Cervical back: Normal range of motion and neck supple.     Right lower leg: No edema.     Left lower leg: No edema.  Skin:    General: Skin is warm and dry.  Neurological:     General: No focal deficit present.     Mental Status: He is alert and oriented to person, place, and time.     (all labs ordered are listed, but only abnormal results are displayed) Labs Reviewed  BASIC METABOLIC PANEL WITH GFR - Abnormal; Notable for the following components:      Result Value   Glucose, Bld 121 (*)    All other components within normal limits  CBC WITH DIFFERENTIAL/PLATELET - Abnormal; Notable for the following components:  Hemoglobin 12.9 (*)    MCV 79.3 (*)    MCH 25.7 (*)    All other components within normal limits  TROPONIN T, HIGH SENSITIVITY  TROPONIN T, HIGH SENSITIVITY    EKG: EKG Interpretation Date/Time:  Sunday June 28 2024 15:39:06 EDT Ventricular Rate:  61 PR Interval:  177 QRS Duration:  106 QT Interval:  420 QTC Calculation: 423 R Axis:   61  Text Interpretation: Sinus rhythm Borderline repolarization abnormality Minimal ST elevation, lateral leads No significant change since last tracing Confirmed by Patsey Lot [phone]) on 06/28/2024 4:07:41 PM  Radiology: [facility] Chest 2 View Result Date: 06/28/2024 CLINICAL DATA:  Chest pain. EXAM: CHEST - 2 VIEW COMPARISON:  07/18/2022. FINDINGS: The heart is borderline enlarged the mediastinal contour is within normal limits. 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 No consolidation, effusion, or pneumothorax is seen.  No acute osseous abnormality. IMPRESSION: No active cardiopulmonary disease. Electronically Signed   By: Leita Birmingham M.D.   On: 06/28/2024 16:51     Procedures   Medications Ordered in the ED - No data to display  Clinical Course as of 06/28/24 1941  Sun Jun 28, 2024  1609 Patient to ED for evaluation of central chest pain since this morning as further described in the HPI. Currently, his discomfort is a 1 down from a 9 at home. Labs pending. EKG unchanged from previous.   Chart reviewed. He had a cardiac work up in 2023 consisting of Coronary CT that showed nonobstructive disease and a calcium  score 0, normal TEE with EF >75%. Cardiology released him at that time and he has had no cause to return to them.  [SU]  1940 Troponin x 2 negative. No further symptoms. No EKG changes, CXR without effusion, PTX, PNA or edema. Patient is felt stable for discharge home and is encouraged to follow up with his doctor for recheck.  [SU]    Clinical Course User Index [SU] Adam Balls, PA-C                                 Medical Decision Making Amount and/or Complexity of Data Reviewed Labs: ordered. Radiology: ordered.        Final diagnoses:  Nonspecific chest pain    ED Discharge Orders     None          Adam Mueller 06/28/24 1941    Patsey Lot, MD 06/28/24 2258

## 2024-06-28 NOTE — ED Triage Notes (Signed)
 Pt reports intermittent CP across middle chest since this morning; sts he feels funny; denies other sxs

## 2024-06-28 NOTE — ED Notes (Signed)
 Patient transported to X-ray

## 2024-06-28 NOTE — Discharge Instructions (Signed)
 As we discussed, your lab tests were very reassuring, negative heart studies, chest xray was clear and EKG did not show any changes. You can be discharged home and are encouraged to see your doctor for recheck and close follow up.  Return to the ED with any new or concerning symptoms at any time.

## 2024-09-18 ENCOUNTER — Ambulatory Visit: Admitting: Podiatry

## 2024-09-18 ENCOUNTER — Encounter: Payer: Self-pay | Admitting: Podiatry

## 2024-09-18 DIAGNOSIS — M79675 Pain in left toe(s): Secondary | ICD-10-CM | POA: Diagnosis not present

## 2024-09-18 DIAGNOSIS — M79674 Pain in right toe(s): Secondary | ICD-10-CM

## 2024-09-18 DIAGNOSIS — B351 Tinea unguium: Secondary | ICD-10-CM | POA: Diagnosis not present

## 2024-09-21 NOTE — Progress Notes (Signed)
 Subjective:   Patient ID: Adam Mueller, male   DOB: 58 y.o.   MRN: [id number]   HPI Patient presents with severe nail disease 1-5 both feet that are thick dystrophic and painful for the patient.  Patient has tried different treatment options and needs to have them cut.  Patient does not currently smoke likes to be active   Review of Systems  All other systems reviewed and are negative.       Objective:  Physical Exam Vitals and nursing note reviewed.  Constitutional:      Appearance: He is well-developed.  Pulmonary:     Effort: Pulmonary effort is normal.  Musculoskeletal:        General: Normal range of motion.  Skin:    General: Skin is warm.  Neurological:     Mental Status: He is alert.     Neurovascular status intact muscle strength found to be adequate range of motion within normal limits with patient found to have nail disease 1-5 both feet that are thick and incurvated and hard to wear shoe gear with.  Patient has tried to trim these himself without success and does have good digital perfusion     Assessment:  Ingrown toenail deformity with thick nailbed and pain 1-5 both feet     Plan:  Chronic mycotic nail infection H&P done reviewed  condition and explained to the patient treatment options.  At this point aggressive debridement accomplished will be seen back as needed with all questions answered today
